# Patient Record
Sex: Male | Born: 1995
Health system: Southern US, Community
[De-identification: ages and names within clinical notes are randomized; demographics above are authoritative.]

## PROBLEM LIST (undated history)

## (undated) DIAGNOSIS — F909 Attention-deficit hyperactivity disorder, unspecified type: Secondary | ICD-10-CM

## (undated) DIAGNOSIS — F419 Anxiety disorder, unspecified: Secondary | ICD-10-CM

## (undated) DIAGNOSIS — Z808 Family history of malignant neoplasm of other organs or systems: Secondary | ICD-10-CM

## (undated) HISTORY — DX: Attention-deficit hyperactivity disorder, unspecified type: F90.9

## (undated) HISTORY — DX: Anxiety disorder, unspecified: F41.9

## (undated) HISTORY — DX: Family history of malignant neoplasm of other organs or systems: Z80.8

## (undated) HISTORY — PX: OTHER SURGICAL HISTORY: SHX169

---
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
## 2013-04-03 ENCOUNTER — Institutional Professional Consult (permissible substitution) (INDEPENDENT_AMBULATORY_CARE_PROVIDER_SITE_OTHER): Payer: BC Managed Care – PPO | Admitting: Family

## 2013-04-03 DIAGNOSIS — F411 Generalized anxiety disorder: Secondary | ICD-10-CM

## 2013-04-03 DIAGNOSIS — F909 Attention-deficit hyperactivity disorder, unspecified type: Secondary | ICD-10-CM

## 2013-08-16 ENCOUNTER — Institutional Professional Consult (permissible substitution): Payer: BC Managed Care – PPO | Admitting: Family

## 2013-08-16 DIAGNOSIS — F909 Attention-deficit hyperactivity disorder, unspecified type: Secondary | ICD-10-CM

## 2014-03-28 ENCOUNTER — Institutional Professional Consult (permissible substitution): Payer: BC Managed Care – PPO | Admitting: Family

## 2014-04-09 ENCOUNTER — Institutional Professional Consult (permissible substitution): Payer: BC Managed Care – PPO | Admitting: Family

## 2014-04-09 DIAGNOSIS — F909 Attention-deficit hyperactivity disorder, unspecified type: Secondary | ICD-10-CM

## 2015-05-08 ENCOUNTER — Other Ambulatory Visit: Payer: Self-pay | Admitting: Sports Medicine

## 2015-05-08 DIAGNOSIS — S6991XA Unspecified injury of right wrist, hand and finger(s), initial encounter: Secondary | ICD-10-CM

## 2015-10-24 ENCOUNTER — Other Ambulatory Visit: Payer: Self-pay | Admitting: Gastroenterology

## 2015-10-24 DIAGNOSIS — R110 Nausea: Secondary | ICD-10-CM

## 2015-10-27 ENCOUNTER — Institutional Professional Consult (permissible substitution): Payer: Self-pay | Admitting: Family

## 2015-10-28 ENCOUNTER — Institutional Professional Consult (permissible substitution) (INDEPENDENT_AMBULATORY_CARE_PROVIDER_SITE_OTHER): Payer: 59 | Admitting: Family

## 2015-10-28 DIAGNOSIS — F411 Generalized anxiety disorder: Secondary | ICD-10-CM

## 2015-10-28 DIAGNOSIS — F900 Attention-deficit hyperactivity disorder, predominantly inattentive type: Secondary | ICD-10-CM | POA: Diagnosis not present

## 2015-10-29 ENCOUNTER — Ambulatory Visit (HOSPITAL_COMMUNITY)
Admission: RE | Admit: 2015-10-29 | Discharge: 2015-10-29 | Disposition: A | Discharge status: Home / Self Care | Payer: 59 | Source: Ambulatory Visit | Attending: Gastroenterology | Admitting: Gastroenterology

## 2015-10-29 DIAGNOSIS — R110 Nausea: Secondary | ICD-10-CM

## 2015-10-29 DIAGNOSIS — R1011 Right upper quadrant pain: Secondary | ICD-10-CM | POA: Insufficient documentation

## 2015-10-29 DIAGNOSIS — K838 Other specified diseases of biliary tract: Secondary | ICD-10-CM | POA: Diagnosis not present

## 2015-11-06 ENCOUNTER — Ambulatory Visit (HOSPITAL_COMMUNITY): Payer: BC Managed Care – PPO

## 2015-11-12 ENCOUNTER — Ambulatory Visit (HOSPITAL_COMMUNITY)
Admission: RE | Admit: 2015-11-12 | Discharge: 2015-11-12 | Disposition: A | Discharge status: Home / Self Care | Payer: 59 | Source: Ambulatory Visit | Attending: Gastroenterology | Admitting: Gastroenterology

## 2015-11-12 DIAGNOSIS — R110 Nausea: Secondary | ICD-10-CM | POA: Diagnosis present

## 2015-11-12 MED ORDER — TECHNETIUM TC 99M MEBROFENIN IV KIT
5.0000 | PACK | Freq: Once | INTRAVENOUS | Status: AC | PRN
  Administered 2015-11-12: 5.08 via INTRAVENOUS

## 2015-11-17 ENCOUNTER — Encounter: Payer: Self-pay | Admitting: Family

## 2015-11-17 ENCOUNTER — Ambulatory Visit (INDEPENDENT_AMBULATORY_CARE_PROVIDER_SITE_OTHER): Payer: 59 | Admitting: Family

## 2015-11-17 VITALS — BP 102/66 | HR 66 | Resp 18 | Ht 74.0 in | Wt 185.2 lb

## 2015-11-17 DIAGNOSIS — F411 Generalized anxiety disorder: Secondary | ICD-10-CM | POA: Insufficient documentation

## 2015-11-17 DIAGNOSIS — F902 Attention-deficit hyperactivity disorder, combined type: Secondary | ICD-10-CM | POA: Insufficient documentation

## 2015-11-17 MED ORDER — SERTRALINE HCL 50 MG PO TABS: 50.0000 mg | ORAL_TABLET | Freq: Every day | ORAL | Status: DC

## 2015-11-17 NOTE — Patient Instructions (Signed)
Continue with medication as prescribed. May want to increase to 1 1/2 tablet of Zoloft 50 mg in 1-2 weeks in needed related to symptoms.

## 2015-11-17 NOTE — Progress Notes (Addendum)
  [facility] [address], [address] [place] [address] Dept: [phone] Dept Fax: [phone] Loc: [phone] Loc Fax: [phone]  Medical Follow-up  Patient ID: Ian [name], male  DOB: 1996/01/23, 20 y.o.  MRN: [id number]  Date of Evaluation: 11/17/15  PCP: Aura DialsBOUSKA,DAVID E, MD  Accompanied by: Mother and self Patient Lives with: patient  HISTORY/CURRENT STATUS:  HPI -Pt here with mother for updates on medication management for Anxiety.  EDUCATION: [employer]: [employer] Year/Grade: sophomore Homework Time: 2 Hours or more Performance/Grades: average Services: Other: Applying for accommodations Activities/Exercise: daily on campus when walking to classes  MEDICAL HISTORY: Appetite: Better then last visit, has eaten more routinely MVI/Other: None Fruits/Vegs:trying to include in diet Calcium: trying to include more in diet Iron:some  Sleep: Bedtime: 10-12:00am in bed Awakens: 10-11:00am, some days earlier Sleep Concerns: Initiation/Maintenance/Other: initiation difficulties if too hot in apt.  Individual Medical History/Review of System Changes? Yes significant weight loss related to increased anxiety, but now has seen Jeanella FlatterySarah Young for therapy to help with eating avoidance.  Allergies: Morphine and related  Current Medications:  Current outpatient prescriptions:  .  sertraline (ZOLOFT) 50 MG tablet, Take 1 tablet (50 mg total) by mouth daily., Disp: 30 tablet, Rfl: 2 Medication Side Effects: Other: Sleep disturbance when taking medication in the am  Family Medical/Social History Changes?: No  MENTAL HEALTH: Mental Health Issues: Anxiety  PHYSICAL EXAM: Vitals:  Today's Vitals   11/17/15 0909  BP: 102/66  Pulse: 66  Resp: 18  Height: 6\' 2"  (1.88 m)  Weight: 185 lb 3.2 oz (84.006 kg)  , Facility age limit for growth percentiles is 20  years.  General Exam: Physical Exam  Constitutional: He is oriented to person, place, and time. He appears well-developed. He is active.  HENT:  Head: Normocephalic and atraumatic.  Right Ear: External ear normal.  Left Ear: External ear normal.  Nose: Nose normal.  Mouth/Throat: Oropharynx is clear and moist.  Eyes: Conjunctivae and EOM are normal. Pupils are equal, round, and reactive to light.  Neck: Normal range of motion. Neck supple.  Cardiovascular: Normal rate, regular rhythm, normal heart sounds and intact distal pulses.   Pulmonary/Chest: Effort normal and breath sounds normal.  Abdominal: Soft. Bowel sounds are normal.  Neurological: He is alert and oriented to person, place, and time. He has normal reflexes.  Skin: Skin is warm and dry.  Psychiatric: He has a normal mood and affect. His behavior is normal. Judgment and thought content normal.  Vitals reviewed.   Neurological: oriented to time, place, and person Cranial Nerves: normal  Neuromuscular:  Motor Mass: normal  Tone: normal Strength: normal DTRs: normal 2+ and symmetric Overflow: none Reflexes: no tremors noted Sensory Exam: Vibratory: intact  Fine Touch: intact  Testing/Developmental Screens: ASRS-11/14  DIAGNOSES:    ICD-9-CM ICD-10-CM   1. Generalized anxiety disorder 300.02 F41.1   2. ADHD (attention deficit hyperactivity disorder), combined type 314.01 F90.2     RECOMMENDATIONS: Continue with medication as prescribed. May need to increase to 75mg  (Zoloft 50mg  1 1/2) in 1-2 weeks.  NEXT APPOINTMENT: Return in about 1 month (around 12/18/2015).  More than 50% of the appointment was spent counseling and discussing diagnosis and management of symptoms with the patient and family.  Carron Curieawn M. Paretta-Leahey, NP Counseling Time: 45  Total Contact Time: 50

## 2015-12-03 ENCOUNTER — Ambulatory Visit: Payer: 59 | Admitting: Psychology

## 2015-12-17 ENCOUNTER — Encounter: Payer: Self-pay | Admitting: Family

## 2015-12-17 ENCOUNTER — Ambulatory Visit (INDEPENDENT_AMBULATORY_CARE_PROVIDER_SITE_OTHER): Payer: 59 | Admitting: Family

## 2015-12-17 VITALS — BP 118/72 | HR 68 | Resp 20 | Ht 72.0 in | Wt 184.8 lb

## 2015-12-17 DIAGNOSIS — F411 Generalized anxiety disorder: Secondary | ICD-10-CM | POA: Diagnosis not present

## 2015-12-17 DIAGNOSIS — F902 Attention-deficit hyperactivity disorder, combined type: Secondary | ICD-10-CM

## 2015-12-17 NOTE — Progress Notes (Signed)
  [facility] [address], [address] [place] [address] Dept: [phone] Dept Fax: [phone] Loc: [phone] Loc Fax: [phone]  Medical Follow-up  Patient ID: Ian [name], male  DOB: 18-Jan-1996, 20 y.o.  MRN: [id number]  Date of Evaluation: 12/17/15  PCP: Aura DialsBOUSKA,DAVID E, MD  Accompanied by: Mother and patient Patient Lives with: mother and stepfather  HISTORY/CURRENT STATUS:  HPI  Patient here for routine follow up related to Anxiety and medication management.  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  Individual Medical History/Review of System Changes? No  Allergies: Morphine and related  Current Medications:  Current outpatient prescriptions:  .  sertraline (ZOLOFT) 50 MG tablet, Take 1 tablet (50 mg total) by mouth daily., Disp: 30 tablet, Rfl: 2 Medication Side Effects: Other: Nothing reported recently with increased dose to 50 mg  Zoloft daily.  Family Medical/Social History Changes?: No, mother and step-father [place], [place]  MENTAL HEALTH: Mental Health Issues: Anxiety-still dealing with some, but more able to handle it.  PHYSICAL EXAM: Vitals:  Today's Vitals   12/17/15 0949  BP: 118/72  Pulse: 68  Resp: 20  Height: 6' (1.829 m)  Weight: 184 lb 12.8 oz (83.825 kg)  , Facility age limit for growth percentiles is 20 years.  General Exam: Physical Exam  Constitutional: He is oriented to person, place, and time.  He appears well-developed and well-nourished.  HENT:  Head: Normocephalic and atraumatic.  Right Ear: External ear normal.  Left Ear: External ear normal.  Nose: Nose normal.  Mouth/Throat: Oropharynx is clear and moist.  Eyes: Conjunctivae and EOM are normal. Pupils are equal, round, and reactive to light.  Neck: Trachea normal, normal range of motion and full passive range of motion without pain. Neck supple.  Cardiovascular: Normal rate, regular rhythm, normal heart sounds and intact distal pulses.   Pulmonary/Chest: Effort normal and breath sounds normal.  Abdominal: Soft. Bowel sounds are normal.  Musculoskeletal: Normal range of motion.  Neurological: He is alert and oriented to person, place, and time. He has normal reflexes.  Skin: Skin is warm, dry and intact.  Psychiatric: He has a normal mood and affect. His behavior is normal. Judgment and thought content normal.  Vitals reviewed.   Neurological: oriented to time, place, and person Cranial Nerves: normal  Neuromuscular:  Motor Mass: normal Tone: normal Strength: normal DTRs: 2+ and symmetric Overflow: none Reflexes: no tremors noted Sensory Exam: Vibratory: intact  Fine Touch: intact  Testing/Developmental Screens:  ASRS-11/12 scored by patient     DIAGNOSES:    ICD-9-CM ICD-10-CM   1. Generalized anxiety disorder 300.02 F41.1   2. ADHD (attention deficit hyperactivity disorder), combined type 314.01 F90.2     RECOMMENDATIONS: Continue with medication of 50 mg Zoloft 1 daily, no script today.  NEXT APPOINTMENT: Return in about 3 months (around 03/17/2016) for follow up for 3 month viist.   Carron Curieawn M Paretta-Leahey, NP Counseling Time: 30 mins Total Contact Time: 40 mins  More than 50% of this visit was spent in counseling and coordination of care.

## 2016-01-30 ENCOUNTER — Telehealth: Payer: Self-pay | Admitting: Family

## 2016-01-30 MED ORDER — SERTRALINE HCL 100 MG PO TABS: 100.0000 mg | ORAL_TABLET | Freq: Every day | ORAL | Status: DC

## 2016-01-30 NOTE — Telephone Encounter (Signed)
RX for Zoloft 100 mg e-scribed and [facility].

## 2016-03-10 ENCOUNTER — Encounter: Payer: Self-pay | Admitting: Family

## 2016-03-10 ENCOUNTER — Ambulatory Visit (INDEPENDENT_AMBULATORY_CARE_PROVIDER_SITE_OTHER): Payer: 59 | Admitting: Family

## 2016-03-10 VITALS — BP 122/78 | HR 78 | Resp 16

## 2016-03-10 DIAGNOSIS — F902 Attention-deficit hyperactivity disorder, combined type: Secondary | ICD-10-CM | POA: Diagnosis not present

## 2016-03-10 DIAGNOSIS — F411 Generalized anxiety disorder: Secondary | ICD-10-CM | POA: Diagnosis not present

## 2016-03-10 NOTE — Progress Notes (Signed)
[name] [address], [address] [place] [address] Dept: [phone] Dept Fax: [phone] Loc: [phone] Loc Fax: [phone]  Medical Follow-up  Patient ID: Ian HallsBryan M [name], male  DOB: [birth year], 20 y.o.  MRN: [id number]  Date of Evaluation: 03/10/16  PCP: Aura DialsBOUSKA,Ian E, MD  Accompanied by: Self Patient Lives with: Self  HISTORY/CURRENT STATUS:  HPI  Patient here for routine follow up related to Anxiety/ADHD and medication management. Patient taking medication without side effects and getting positive results for decreasing his anxiety.  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 About 2 miles daily. Current Exercise Habits: The patient has a physically strenous job, but has no regular exercise apart from work., Intensity: Mild     MEDICAL HISTORY: Appetite: Better now and eating routinely. Eating 2-3 meals out-fast food. MVI/Other: None Fruits/Vegs:Some Calcium: Some Iron:Some  Sleep: Bedtime: 9:00-pm-2:00 am Awakens: 9:00 am Sleep Concerns: Initiation/Maintenance/Other: Better, Once every 2-3 weeks will have a hard night of sleeping  Individual Medical History/Review of System Changes? None reported since last visit.  Allergies: Morphine and related  Current Medications:  Current outpatient prescriptions:  .  sertraline (ZOLOFT) 100 MG tablet, Take 1 tablet (100 mg total) by mouth daily., Disp: 30 tablet, Rfl: 2 Medication Side Effects: None  Family Medical/Social History Changes?: No  MENTAL HEALTH: Mental Health Issues: Anxiety-Better now with Zoloft No flowsheet data found.   PHYSICAL EXAM: Vitals:    Today's Vitals   03/10/16 0927  BP: 122/78  Pulse: 78  Resp: 16  , Facility age limit for growth percentiles is 20 years.  General Exam: Physical Exam  Constitutional: He is oriented to person, place, and time. He appears well-developed and well-nourished.  HENT:  Head: Normocephalic and atraumatic.  Right Ear: External ear normal.  Left Ear: External ear normal.  Nose: Nose normal.  Mouth/Throat: Oropharynx is clear and moist.  Eyes: Conjunctivae and EOM are normal. Pupils are equal, round, and reactive to light.  Neck: Trachea normal, normal range of motion and full passive range of motion without pain. Neck supple.  Cardiovascular: Normal rate, regular rhythm, normal heart sounds and intact distal pulses.   Pulmonary/Chest: Effort normal and breath sounds normal.  Abdominal: Soft. Bowel sounds are normal.  Musculoskeletal: Normal range of motion.  Neurological: He is alert and oriented to person, place, and time. He has normal reflexes.  Skin: Skin is warm, dry and intact.  Psychiatric: He has a normal mood and affect. His behavior is normal. Judgment and thought content normal.  Vitals reviewed.  No concerns for toileting. Daily stool, no constipation or diarrhea. Void urine no difficulty. No enuresis.   Participate in daily oral hygiene to include brushing and flossing.  Neurological: oriented to time, place, and person Cranial Nerves: normal  Neuromuscular:  Motor Mass: Normal Tone: Normal Strength: Normal DTRs: 2+ and symmetric Overflow: None Reflexes: no tremors noted Sensory Exam: Vibratory: Intact  Fine Touch: Intact  Testing/Developmental Screens:  ASRS -15/19    DIAGNOSES:    ICD-9-CM ICD-10-CM   1. Generalized anxiety disorder 300.02 F41.1   2. ADHD (attention deficit hyperactivity disorder), combined type 314.01 F90.2     RECOMMENDATIONS: 3 month follow up and continuation with medication. Zoloft 100 mg daily with no refills today. Will  call in July  for next refill needed.  Continuing to encourage increased good calories for Ian [name] since he has a significant weight loss with his increased anxiety- Nutritional recommendations include the increase of calories, making foods more calorically dense by adding calories to foods eaten.  Increase Protein in the morning.  Parents may add instant breakfast mixes to milk, butter and sour cream to potatoes, and peanut butter dips for fruit.  The parents should discourage "grazing" on foods and snacks through the day and decrease the amount of fluid consumed.  Children are largely volume driven and will fill up on liquids thereby decreasing their appetite for solid foods.  Things that can help decrease anxiety...  Apps: Mindshift StopBreatheThink Relax & Rest Smiling Mind Yoga By [employer]  Websites: Worry [employer]ise [employer] Www.worrywisekids.org  Anxiety & Depression Association of America Www.adaa.org  The Social Anxiety Institute Www.socialanxietyinstitute.org  Suggested following up with counseling related to history of anxiety for CBT.  NEXT APPOINTMENT: Return in about 3 months (around 06/10/2016) for follow up.  More than 50% of the appointment was spent counseling and discussing diagnosis and management of symptoms with the patient and family. Carron Curieawn M Paretta-Leahey, NP Counseling Time: 30 mins Total Contact Time: 40 mins

## 2016-04-29 ENCOUNTER — Other Ambulatory Visit: Payer: Self-pay | Admitting: Family

## 2016-04-30 ENCOUNTER — Other Ambulatory Visit: Payer: Self-pay | Admitting: Family

## 2016-04-30 MED ORDER — SERTRALINE HCL 100 MG PO TABS: 100.0000 mg | ORAL_TABLET | Freq: Every day | ORAL | 2 refills | Status: DC

## 2016-04-30 NOTE — Telephone Encounter (Signed)
Mom called for refill for Zoloft.  Patient last seen 03/10/16, next appointment 05/26/16.  Please [facility] in to [facility] [phone] as soon as possible.  Patient is out of meds.

## 2016-04-30 NOTE — Telephone Encounter (Signed)
RX for Zoloft e-scribed and sent to pharmacy

## 2016-05-26 ENCOUNTER — Institutional Professional Consult (permissible substitution): Payer: Self-pay | Admitting: Family

## 2016-05-31 ENCOUNTER — Ambulatory Visit (INDEPENDENT_AMBULATORY_CARE_PROVIDER_SITE_OTHER): Payer: 59 | Admitting: Family

## 2016-05-31 ENCOUNTER — Encounter: Payer: Self-pay | Admitting: Family

## 2016-05-31 VITALS — BP 108/68 | HR 88 | Resp 16 | Ht 73.75 in | Wt 183.0 lb

## 2016-05-31 DIAGNOSIS — F411 Generalized anxiety disorder: Secondary | ICD-10-CM | POA: Diagnosis not present

## 2016-05-31 DIAGNOSIS — F902 Attention-deficit hyperactivity disorder, combined type: Secondary | ICD-10-CM | POA: Diagnosis not present

## 2016-05-31 MED ORDER — SERTRALINE HCL 100 MG PO TABS: 100.0000 mg | ORAL_TABLET | Freq: Every day | ORAL | 2 refills | Status: DC

## 2016-05-31 NOTE — Progress Notes (Signed)
[facility] [address], [address] [place] [address] Dept: [phone] Dept Fax: [phone] Loc: [phone] Loc Fax: [phone]  Medical Follow-up  Patient ID: Ian HallsBryan M Humes, male  DOB: Apr 29, 1996, 20 y.o.  MRN: [id number]  Date of Evaluation: 05/31/16   PCP: Aura DialsBOUSKA,DAVID E, MD  Accompanied by: Self Patient Lives with: Self  HISTORY/CURRENT STATUS:  HPI  Patient here for routine follow up related to Anxiety/ADHD and medication management. Patient taking 5 classes this semester and at this time handling these well. Taking Zoloft 50 mg 1 daily without any side effects and doing well with decreasing the amount anxiety, especially with school.  EDUCATION: School: [employer] Year/Grade: Sophomore Homework Time: Increased time depending on class demands Performance/Grades: average Services: IEP/504 Plan Activities/Exercise: daily-walking to classes daily  MEDICAL HISTORY: Appetite: Better now, now only 2-3 times/week fast food.  MVI/Other: Daily Fruits/Vegs:some Calcium: some Iron:some  Sleep: Bedtime: 3:00 am latest Awakens: 7:00 am Sleep Concerns: Initiation/Maintenance/Other: Sleeping on the couch past few times with school work to complete on computer. Napping during the day and will not fall asleep until later for the night.   Individual Medical History/Review of System Changes? None reported recently by patient.  Allergies: Morphine and related  Current Medications:  Current Outpatient Prescriptions:  .  sertraline (ZOLOFT) 100 MG tablet, Take 1 tablet (100 mg total) by mouth daily., Disp: 30 tablet, Rfl: 2 Medication Side Effects: None  Family Medical/Social History Changes?: No  MENTAL HEALTH: Mental Health Issues: Anxiety-much less with Zoloft now. 108/62   PHYSICAL EXAM: Vitals:  Today's Vitals   05/31/16 1110  BP: 108/68    Pulse: 88  Resp: 16  Weight: 183 lb (83 kg)  Height: 6' 1.75" (1.873 m)  PainSc: 0-No pain  , Facility age limit for growth percentiles is 20 years.  General Exam: Physical Exam  Constitutional: He is oriented to person, place, and time. He appears well-developed and well-nourished.  HENT:  Head: Normocephalic and atraumatic.  Right Ear: External ear normal.  Left Ear: External ear normal.  Nose: Nose normal.  Mouth/Throat: Oropharynx is clear and moist.  Eyes: Conjunctivae and EOM are normal. Pupils are equal, round, and reactive to light.  Neck: Trachea normal, normal range of motion and full passive range of motion without pain. Neck supple.  Cardiovascular: Normal rate, regular rhythm, normal heart sounds and intact distal pulses.   Pulmonary/Chest: Effort normal and breath sounds normal.  Abdominal: Soft. Bowel sounds are normal.  Musculoskeletal: Normal range of motion.  Neurological: He is alert and oriented to person, place, and time. He has normal reflexes.  Skin: Skin is warm, dry and intact. Capillary refill takes less than 2 seconds.  Psychiatric: He has a normal mood and affect. His behavior is normal. Judgment and thought content normal.  Vitals reviewed.   Neurological: oriented to time, place, and person Cranial Nerves: normal  Neuromuscular:  Motor Mass: Normal Tone: Normal Strength: Normal DTRs: 2+ and symmetric Overflow: None Reflexes: no tremors noted Sensory Exam: Vibratory: Intact  Fine Touch: Intact  Testing/Developmental Screens:  ASRS-15/16 scored by patient     DIAGNOSES:    ICD-9-CM ICD-10-CM   1. ADHD (attention deficit hyperactivity disorder), combined type 314.01 F90.2   2. Generalized anxiety disorder 300.02 F41.1     RECOMMENDATIONS: 3 month follow up and continuation of Zoloft 50 mg 1 daily, # 30 with 2 RF's [facility]  [address].  Doing well with history of anxiety and eating better daily without stomach aches or other GI  complaints. Will continue to monitor and report any changes. Discussed healthy food choices and preplanning meals.   Sleep hygiene reviewed with school schedule. *Teens need about 9 hours of sleep a night. Younger children need more sleep (10-11 hours a night) and adults need slightly less (7-9 hours each night).  11 Tips to Follow:  1. No caffeine after 3pm: Avoid beverages with caffeine (soda, tea, energy drinks, etc.) especially after 3pm. 2. Don't go to bed hungry: Have your evening meal at least 3 hrs. before going to sleep. It's fine to have a small bedtime snack such as a glass of milk and a few crackers but don't have a big meal. 3. Have a nightly routine before bed: Plan on "winding down" before you go to sleep. Begin relaxing about 1 hour before you go to bed. Try doing a quiet activity such as listening to calming music, reading a book or meditating. 4. Turn off the TV and ALL electronics including video games, tablets, laptops, etc. 1 hour before sleep, and keep them out of the bedroom. 5. Turn off your cell phone and all notifications (new email and text alerts) or even better, leave your phone outside your room while you sleep. Studies have shown that a part of your brain continues to respond to certain lights and sounds even while you're still asleep. 6. Make your bedroom quiet, dark and cool. If you can't control the noise, try wearing earplugs or using a fan to block out other sounds. 7. Practice relaxation techniques. Try reading a book or meditating or drain your brain by writing a list of what you need to do the next day. 8. Don't nap unless you feel sick: you'll have a better night's sleep. 9. Don't smoke, or quit if you do. Nicotine, alcohol, and marijuana can all keep you awake. Talk to your health care provider if you need help with substance use. 10. Most importantly, wake up at the same time every day (or within 1 hour of your usual wake up time) EVEN on the weekends. A regular  wake up time promotes sleep hygiene and prevents sleep problems. 11. Reduce exposure to bright light in the last three hours of the day before going to sleep. Maintaining good sleep hygiene and having good sleep habits lower your risk of developing sleep problems. Getting better sleep can also improve your concentration and alertness. Try the simple steps in this guide. If you still have trouble getting enough rest, make an appointment with your health care provider.   NEXT APPOINTMENT: Return in about 3 months (around 08/30/2016) for follow up.  More than 50% of the appointment was spent counseling and discussing diagnosis and management of symptoms with the patient and family.  Carron Curie, NP Counseling Time: 30 mins Total Contact Time: 40 mins

## 2016-08-30 ENCOUNTER — Ambulatory Visit (INDEPENDENT_AMBULATORY_CARE_PROVIDER_SITE_OTHER): Payer: 59 | Admitting: Family

## 2016-08-30 ENCOUNTER — Encounter: Payer: Self-pay | Admitting: Family

## 2016-08-30 VITALS — BP 112/78 | HR 76 | Ht 73.75 in | Wt 185.2 lb

## 2016-08-30 DIAGNOSIS — F411 Generalized anxiety disorder: Secondary | ICD-10-CM | POA: Diagnosis not present

## 2016-08-30 DIAGNOSIS — F902 Attention-deficit hyperactivity disorder, combined type: Secondary | ICD-10-CM | POA: Diagnosis not present

## 2016-08-30 MED ORDER — SERTRALINE HCL 100 MG PO TABS: 100.0000 mg | ORAL_TABLET | Freq: Every day | ORAL | 2 refills | Status: DC

## 2016-08-30 NOTE — Progress Notes (Signed)
[facility] [address], [address] [place] [address] Dept: [phone] Dept Fax: [phone] Loc: [phone] Loc Fax: [phone]  Medical Follow-up  Patient ID: Ian HallsBryan M [name], male  DOB: 28-Feb-1996, 20 y.o.  MRN: [id number]  Date of Evaluation: 08/30/16  PCP: Ian DialsBOUSKA,DAVID E, MD  Accompanied by: Self` Patient Lives with: self  HISTORY/CURRENT STATUS:  HPI  Patient here for routine follow up related to AnxietyADHD and medication management. Patient doing well on current Zoloft 100 mg daily without any side effects reported.   EDUCATION: School: [employer]  Year/Grade: sophomore Homework Time: Increased time depending on class and passed this semester. Marketing classes, finance classes this spring.  Performance/Grades: average Services: Other: [employer] if needed Activities/Exercise: rarely, walking on campus  MEDICAL HISTORY: Appetite: Better, no problems finding snacks or eating.  MVI/Other: None Fruits/Vegs:Some Calcium: Some Iron:Some  Sleep: Bedtime: 9:00-2:00 am Awakens: 9:00 am Sleep Concerns: Initiation/Maintenance/Other: No problems while school in session  Individual Medical History/Review of System Changes? None recently, per patient's report. Has has some contact with father on a regular basis, no problems with   Allergies: Morphine and related  Current Medications:  Current Outpatient Prescriptions:  .  sertraline (ZOLOFT) 100 MG tablet, Take 1 tablet (100 mg total) by mouth daily., Disp: 30 tablet, Rfl: 2 Medication Side Effects: None  Family Medical/Social History Changes?: No  MENTAL HEALTH: Mental Health Issues: Anxiety-Decreased on Zoloft 100 mg daily, no suicidal ideations or thoughts.   PHYSICAL EXAM: Vitals: There were no vitals filed for this visit., Facility age limit for growth percentiles is 20  years.  General Exam: Physical Exam  Constitutional: He is oriented to person, place, and time. He appears well-developed and well-nourished.  HENT:  Head: Normocephalic and atraumatic.  Right Ear: External ear normal.  Left Ear: External ear normal.  Nose: Nose normal.  Mouth/Throat: Oropharynx is clear and moist.  Eyes: Conjunctivae and EOM are normal. Pupils are equal, round, and reactive to light.  Neck: Trachea normal, normal range of motion and full passive range of motion without pain. Neck supple.  Cardiovascular: Normal rate, regular rhythm, normal heart sounds and intact distal pulses.   Pulmonary/Chest: Effort normal and breath sounds normal.  Abdominal: Soft. Bowel sounds are normal.  Musculoskeletal: Normal range of motion.  Neurological: He is alert and oriented to person, place, and time. He has normal reflexes.  Skin: Skin is warm, dry and intact. Capillary refill takes less than 2 seconds.  Psychiatric: He has a normal mood and affect. His behavior is normal. Judgment and thought content normal.  Vitals reviewed.  No concerns for toileting. Daily stool, no constipation or diarrhea. Void urine no difficulty. No enuresis.   Participate in daily oral hygiene to include brushing and flossing.  Neurological: oriented to time, place, and person Cranial Nerves: normal  Neuromuscular:  Motor Mass: Normal Tone: Normal Strength: Normal  DTRs: 2+ and symmetric Overflow: None Reflexes: no tremors noted Sensory Exam: Vibratory: Intact  Fine Touch: Intact  Testing/Developmental Screens:  ASRS-11/13 scored by patient and reviewed     DIAGNOSES:    ICD-9-CM ICD-10-CM   1. Generalized anxiety disorder 300.02 F41.1   2. ADHD (attention deficit hyperactivity disorder), combined type 314.01 F90.2     RECOMMENDATIONS: 3 month follow up and medication management. Zoloft 100 mg daily, # 30 with 2 RF's escribed to [facility] in [place].  Sleep hygiene issues were discussed and  educational information was provided.  The discussion included sleep cycles, sleep hygiene, the importance of avoiding TV and video screens for the hour before bedtime, dietary sources of melatonin and the use of melatonin supplementation.  Supplemental melatonin 1 to 3 mg, can be used at bedtime to assist with sleep onset, as needed.  Give 1.5 to 3 mg, one hour before bedtime and repeat if not asleep in one hour.  When a good sleep routine is established, stop daily administration and give on nights the patient is not asleep in 30 minutes after lights out.   Continuation of daily oral hygiene to include flossing and brushing daily, using antimicrobial toothpaste, as well as routine dental exams and twice yearly cleaning.  Recommend supplementation with a multivitamin and omega-3 fatty acids daily.  Maintain adequate intake of Calcium and Vitamin D.  NEXT APPOINTMENT: Return in about 3 months (around 11/28/2016) for follow up visit.  More than 50% of the appointment was spent counseling and discussing diagnosis and management of symptoms with the patient and family.  Carron Curieawn M Paretta-Leahey, NP Counseling Time: 30 mins Total Contact Time: 40 mins

## 2016-11-16 IMAGING — NM NM HEPATO W/GB/PHARM/[PERSON_NAME]
1 series · 1 of 1 positions shown · non-contrast
Comparison: Abdominal ultrasound 10/29/2015

CLINICAL DATA: Abdominal pain, nausea and vomiting and intolerance
to fatty foods for 1 month.

EXAM:
NUCLEAR MEDICINE HEPATOBILIARY IMAGING WITH GALLBLADDER EF
TECHNIQUE: Sequential images of the abdomen were obtained [DATE] minutes
following intravenous administration of radiopharmaceutical. After
slow intravenous infusion of 1.7 micrograms Cholecystokinin,
gallbladder ejection fraction was determined.
RADIOPHARMACEUTICALS:  5.08 mCi Kc-SSm Choletec IV

[rt lat · 1 of 1 slices shown]
[im 1/1]
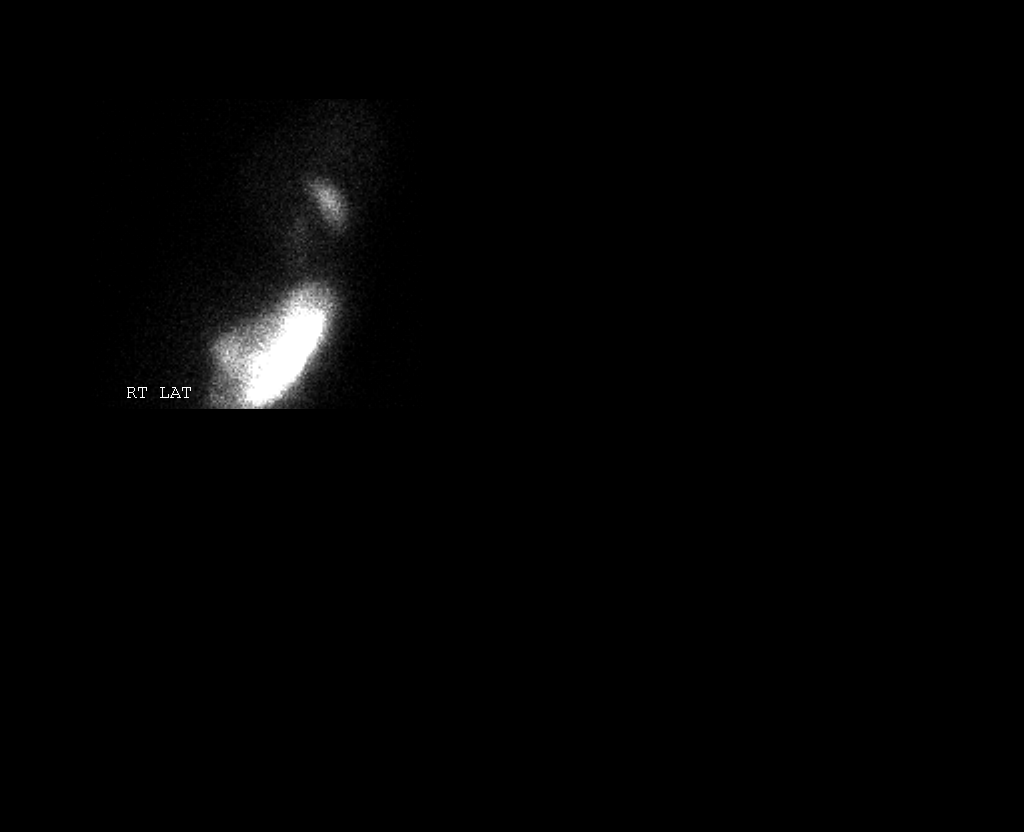

[1 of 1 positions shown; findings below may reference images not displayed]

FINDINGS: Symmetric uptake in the liver and prompt excretion into the biliary
tree which is visualized at 10 minutes. Activity is seen in the
gallbladder at 55 minutes. Small bowel activity is seen at 15
minutes. Patient received a protocoled infusion of CCK. Gallbladder
ejection fraction was calculated at 69.8%. At 60 min, normal
ejection fraction is greater than 40%.
IMPRESSION: 1. Normal biliary patency study.
2. Normal gallbladder ejection fraction.

## 2016-12-15 ENCOUNTER — Other Ambulatory Visit: Payer: Self-pay | Admitting: Family

## 2016-12-15 NOTE — Telephone Encounter (Signed)
Has not been seen since 08/2016

## 2017-04-17 ENCOUNTER — Other Ambulatory Visit: Payer: Self-pay | Admitting: Family

## 2017-05-27 ENCOUNTER — Telehealth: Payer: Self-pay | Admitting: Family

## 2017-05-27 MED ORDER — SERTRALINE HCL 100 MG PO TABS: 100.0000 mg | ORAL_TABLET | Freq: Every day | ORAL | 0 refills | Status: DC

## 2017-05-27 NOTE — Telephone Encounter (Signed)
Escribed Zoloft 100 mg script # 30 with no refills to [facility]. Note included that it was the LAST SCRIPT UNTIL SEEN.

## 2017-05-30 ENCOUNTER — Ambulatory Visit (INDEPENDENT_AMBULATORY_CARE_PROVIDER_SITE_OTHER): Payer: 59 | Admitting: Family

## 2017-05-30 ENCOUNTER — Encounter: Payer: Self-pay | Admitting: Family

## 2017-05-30 VITALS — BP 112/64 | HR 72 | Resp 16 | Ht 73.75 in | Wt 182.4 lb

## 2017-05-30 DIAGNOSIS — Z79899 Other long term (current) drug therapy: Secondary | ICD-10-CM

## 2017-05-30 DIAGNOSIS — F411 Generalized anxiety disorder: Secondary | ICD-10-CM | POA: Diagnosis not present

## 2017-05-30 DIAGNOSIS — Z7189 Other specified counseling: Secondary | ICD-10-CM | POA: Diagnosis not present

## 2017-05-30 DIAGNOSIS — F902 Attention-deficit hyperactivity disorder, combined type: Secondary | ICD-10-CM | POA: Diagnosis not present

## 2017-05-30 DIAGNOSIS — Z719 Counseling, unspecified: Secondary | ICD-10-CM

## 2017-05-30 NOTE — Progress Notes (Signed)
[facility] [address], [place] [place] [address] Dept: [phone] Dept Fax: [phone] Loc: [phone] Loc Fax: [phone]  Medical Follow-up  Patient ID: Ian [name], male  DOB: Apr 07, 1996, 21 y.o.  MRN: [id number]  Date of Evaluation: 05/1717  PCP: Tracey Harries, MD  Accompanied by: self Patient Lives with: Linton Ham to [address] place [address] on [place]  HISTORY/CURRENT STATUS:  HPI  Patient here for routine follow up related to Anxiety, ADHD and medication management. Patient here by himself for today's visit. Interactive and cooperative with provider. Patient has continued with school and will graduate after this semester. 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   EDUCATION: School: [employer]  Year/Grade: Last semester, graduates after this semester Homework Time: Depending on class demands Performance/Grades: average Services: IEP/504 Plan and Other: tutoring as needed Activities/Exercise: intermittently-walking on campus Working: Company in [employer] and working on Administrator, sports aspect of company.   MEDICAL HISTORY: Appetite: Good, more variety of foods daily. MVI/Other: Daily Fruits/Vegs:Better Calcium: Good  Iron:Variety  Sleep: Not getting 8 hours/night, some nights not sleeping but only a few hours.  Sleep Concerns: Initiation/Maintenance/Other: Off and on with new place recently  Individual Medical History/Review of System Changes? Yes, mild case of viral infection with OTC treatment.   Allergies: Morphine and related  Current Medications:  Current Outpatient Prescriptions:  .  sertraline (ZOLOFT) 100 MG tablet, TAKE 1 TABLET BY MOUTH DAILY, Disp: 30 tablet, Rfl: 0 .  sertraline (ZOLOFT) 100 MG tablet, Take 1 tablet (100 mg total) by  mouth daily., Disp: 30 tablet, Rfl: 0 Medication Side Effects: None  Family Medical/Social History Changes?: None, mother remarried and moved to [place].   MENTAL HEALTH: Mental Health Issues: Anxiety-less now with continuation of Zoloft 100 mg daily. Nos suicidal thoughts or ideations.   PHYSICAL EXAM: Vitals:  Today's Vitals   05/30/17 0914  BP: 112/64  Pulse: 72  Resp: 16  Weight: 182 lb 6.4 oz (82.7 kg)  Height: 6' 1.75" (1.873 m)  PainSc: 0-No pain  , Facility age limit for growth percentiles is 20 years.  General Exam: Physical Exam  Constitutional: He is oriented to person, place, and time. He appears well-developed and well-nourished.  HENT:  Head: Normocephalic and atraumatic.  Right Ear: External ear normal.  Left Ear: External ear normal.  Nose: Nose normal.  Mouth/Throat: Oropharynx is clear and moist.  Eyes: Pupils are equal, round, and reactive to light. Conjunctivae and EOM are normal.  Neck: Trachea normal, normal range of motion and full passive range of motion without pain. Neck supple.  Cardiovascular: Normal rate, regular rhythm, normal heart sounds and intact distal pulses.   Pulmonary/Chest: Effort normal and breath sounds normal.  Abdominal: Soft. Bowel sounds are normal.  Genitourinary:  Genitourinary Comments: Deferred  Musculoskeletal: Normal range of motion.  Neurological: He is alert and oriented to person, place, and time. He has normal reflexes.  Skin: Skin is warm, dry and intact. Capillary refill takes less than 2 seconds.  Psychiatric: He has a normal mood and affect. His behavior is normal. Judgment and thought content normal.  Vitals reviewed.  Review of Systems  Psychiatric/Behavioral: The patient is nervous/anxious.   All other systems reviewed and are negative.  Patient reports no concerns for toileting. Daily stool, no constipation or diarrhea. Void urine no difficulty. No enuresis.  Participate in daily oral hygiene to  include brushing and flossing.  Neurological: oriented to time, place, and person Cranial Nerves: normal  Neuromuscular:  Motor Mass: Normal Tone: Normal Strength: Normal DTRs: 2+ and symmetric Overflow: None Reflexes: no tremors noted Sensory Exam: Vibratory: Intact  Fine Touch: Intact  Testing/Developmental Screens:  ASRS-15/17 scored by patient and counseled   DIAGNOSES:    ICD-10-CM   1. Generalized anxiety disorder F41.1   2. ADHD (attention deficit hyperactivity disorder), combined type F90.2   3. Patient counseled Z71.9   4. Medication management Z79.899   5. Goals of care, counseling/discussion Z71.89     RECOMMENDATIONS: 3 month follow up and continuation of medication. Counseled on medication management. Script escribed for Zoloft 100 mg daily, # 30 with 2 RF's sent to [facility] on 05/27/17.  Medication adherence reviewed with patient along with efficacy of taking daily dosing to control his symptoms of anxiety.   Information reviewed with patient regarding projected graduation after this semester and seeking full-time employment.  Reviewed at length sleep hygiene with patient and conforming to a schedule for adequate sleep each night. 8-10 hours needed and patient lacking this due to electronic or other busy night time activities.   Recommended decreasing screen time at night about 1 hour before HS to allow brain to "settle down" and sleep initiation will be much easier.   Advised on healthy eating habits with lean protein, fruits, vegetables and nuts with limited fast foods or junk foods. Also encouraged increasing water intake and eating at least 3 meals daily without skipping meals.   Directed patient to f/u with PCP yearly, updated vaccines and flu shot, dentist every 6 months, MVI daily and regular exercise for health maintenance.   NEXT APPOINTMENT: Return in about 3 months (around 08/29/2017) for follow up visit.  More than 50% of the appointment was spent  counseling and discussing diagnosis and management of symptoms with the patient and family.  Carron Curie, NP Counseling Time: 30 mins Total Contact Time: 40 mins

## 2017-07-01 ENCOUNTER — Other Ambulatory Visit: Payer: Self-pay | Admitting: Family

## 2017-07-01 NOTE — Telephone Encounter (Signed)
Not seen since 08/2016. Has appointment scheduled for 08/2017. Approved 1 month supply

## 2017-08-05 ENCOUNTER — Other Ambulatory Visit: Payer: Self-pay | Admitting: Pediatrics

## 2017-08-08 ENCOUNTER — Other Ambulatory Visit: Payer: Self-pay | Admitting: Family

## 2017-08-08 MED ORDER — SERTRALINE HCL 100 MG PO TABS: 100.0000 mg | ORAL_TABLET | Freq: Every day | ORAL | 0 refills | Status: DC

## 2017-08-08 NOTE — Telephone Encounter (Signed)
[facility].  Patient last seen 05/30/17, next appointment 08/31/17.  Second request.

## 2017-08-08 NOTE — Telephone Encounter (Signed)
Zoloft 100mg  #30 [facility]

## 2017-08-31 ENCOUNTER — Ambulatory Visit: Payer: 59 | Admitting: Family

## 2017-08-31 ENCOUNTER — Encounter: Payer: Self-pay | Admitting: Family

## 2017-08-31 VITALS — BP 122/70 | HR 78 | Resp 16 | Ht 74.0 in | Wt 194.8 lb

## 2017-08-31 DIAGNOSIS — F902 Attention-deficit hyperactivity disorder, combined type: Secondary | ICD-10-CM

## 2017-08-31 DIAGNOSIS — F411 Generalized anxiety disorder: Secondary | ICD-10-CM | POA: Diagnosis not present

## 2017-08-31 DIAGNOSIS — Z719 Counseling, unspecified: Secondary | ICD-10-CM | POA: Diagnosis not present

## 2017-08-31 DIAGNOSIS — Z79899 Other long term (current) drug therapy: Secondary | ICD-10-CM | POA: Diagnosis not present

## 2017-08-31 DIAGNOSIS — G479 Sleep disorder, unspecified: Secondary | ICD-10-CM

## 2017-08-31 MED ORDER — SERTRALINE HCL 100 MG PO TABS: 100.0000 mg | ORAL_TABLET | Freq: Every day | ORAL | 2 refills | Status: DC

## 2017-08-31 NOTE — Progress Notes (Signed)
[facility] [address], [address] [place] [address] Dept: [phone] Dept Fax: [phone] Loc: [phone] Loc Fax: [phone]  Medical Follow-up  Patient ID: Ian [name], male  DOB: [birth year], 21 y.o.  MRN: [id number]  Date of Evaluation: 08/31/17  PCP: Tracey HarriesBouska, David, MD  Accompanied by: Self Patient Lives with: by himself with cats  HISTORY/CURRENT STATUS:  HPI  Patient here for routine follow up related to ADHD, Anxiety, and medication management. Patient here by himself and graduated recently from [employer]. Interactive with provider and talkative regarding job searching. Living by himself with [place] 2 cats [place] and not looking to relocate.   EDUCATION/WORK: School: [employer]  Year/Grade: Graduated recently  Performance/Grades: average Services: Other: Help as needed in an academic center Activities/Exercise: intermittently, not much Working: Not regularly and looking for work now that he's graduated.  MEDICAL HISTORY: Appetite: Good MVI/Other: Daily Fruits/Vegs:Some Calcium: Good Iron:Variety  Sleep: Bedtime: 5-7 hours most nights.  Sleep Concerns: Initiation/Maintenance/Other: Not having regular issues with waking.   Individual Medical History/Review of System Changes? Yes, no recent problems or sickness/illnesses.   Allergies: Morphine and related  Current Medications:  Current Outpatient Medications:  .  sertraline (ZOLOFT) 100 MG tablet, Take 1 tablet (100 mg total) by mouth daily., Disp: 30 tablet, Rfl: 2 Medication Side Effects: None  Family Medical/Social History Changes?: Yes, mother and new [place].   MENTAL HEALTH: Mental Health Issues: Anxiety-under control and has continued daily dosing, Zoloft 100 mg daily without side effects. No suicidal thoughts or ideations reported.    PHYSICAL EXAM: Vitals:  Today's Vitals   08/31/17 1412  BP: 122/70  Pulse: 78  Resp: 16  Weight: 194 lb 12.8 oz (88.4 kg)  Height: 6\' 2"  (1.88 m)  PainSc: 0-No pain  , Facility age limit for growth percentiles is 20 years.  General Exam: Physical Exam  Constitutional: He is oriented to person, place, and time. He appears well-developed and well-nourished.  HENT:  Head: Normocephalic and atraumatic.  Right Ear: External ear normal.  Left Ear: External ear normal.  Nose: Nose normal.  Mouth/Throat: Oropharynx is clear and moist.  Eyes: Conjunctivae and EOM are normal. Pupils are equal, round, and reactive to light.  Neck: Trachea normal, normal range of motion and full passive range of motion without pain. Neck supple.  Cardiovascular: Normal rate, regular rhythm, normal heart sounds and intact distal pulses.  Pulmonary/Chest: Effort normal and breath sounds normal.  Abdominal: Soft. Bowel sounds are normal.  Genitourinary:  Genitourinary Comments: Deferred  Musculoskeletal: Normal range of motion.  Neurological: He is alert and oriented to person, place, and time. He has normal reflexes.  Skin: Skin is warm, dry and intact. Capillary refill takes less than 2 seconds.  Psychiatric: He has a normal mood and affect. His behavior is normal. Judgment and thought content normal.  Vitals reviewed.  Review of Systems  Psychiatric/Behavioral: Positive for decreased concentration and sleep disturbance.  All other systems reviewed and are negative.  Patient with no concerns for toileting. Daily stool, no constipation or diarrhea. Void urine no difficulty. No enuresis.   Participate in daily oral hygiene to include brushing and flossing.  Neurological: oriented to time, place, and person Cranial Nerves: normal  Neuromuscular:  Motor Mass: Normal Tone: Normal Strength: Normal  DTRs: 2+ and symmetric Overflow: None Reflexes: no tremors noted Sensory Exam: Vibratory: Intact  Fine Touch: Intact  Testing/Developmental Screens:  ASRS-12/18 scored by paitent and counseled today    DIAGNOSES:    ICD-10-CM   1. Generalized anxiety disorder F41.1   2. ADHD (attention deficit hyperactivity disorder), combined type F90.2   3. Sleep disturbances G47.9   4. Medication management Z79.899   5. Patient counseled Z71.9     RECOMMENDATIONS: 3 month follow up and continuation of medication. Counseled on medication management along with adherence. Zoloft 100 mg daily, # 30 with 2 RF's escribed to pharmacy on file.   Anxiety medications reviewed with patient to include medication along with  pharmacologic properties of each. Recommendation for specific medication to include dose, administration, expected effects, possible side effects and the risk to benefit ratio of medication management (Zoloft).  Information regarding graduation and job searching in the [place] area discussed with patient. Support and encouragement provided with upcoming interview.  Recommended better eating habits with enough protein, fruits, vegetables and more water intake daily. Suggested limiting ordering out in the evening or fast foods, avoiding sugary foods/drinks and junk foods for weight management.  Suggested patient to start to incorporated regular exercise into his daily routine. Patient concerns with weight gain of 12#, but not from weight itself, the amount that is fat and not muscle. Encouaraged regular cardiovascular exercise with weight training 3-4 days/week.   Directed to f/u with PCP yearly, routine dental visits, MVI daily, healthy eating habits, regular exercise and good sleep hygiene.   NEXT APPOINTMENT: Return in about 3 months (around 11/29/2017) for follow up visit.  More than 50% of the appointment was spent counseling and discussing diagnosis and management of symptoms with the patient and family.  Carron Curieawn M Paretta-Leahey, NP Counseling Time: 30 mins Total Contact Time: 40  mins

## 2017-12-02 ENCOUNTER — Institutional Professional Consult (permissible substitution): Payer: Self-pay | Admitting: Family

## 2017-12-02 ENCOUNTER — Telehealth: Payer: Self-pay | Admitting: Family

## 2017-12-02 NOTE — Telephone Encounter (Signed)
Called patient re no-show.  He said he was unaware of the appointment and asked to reschedule.  I reviewed the no-show policy with him, and he is aware of the charge.  Rescheduled for 12/07/17.

## 2017-12-07 ENCOUNTER — Ambulatory Visit (INDEPENDENT_AMBULATORY_CARE_PROVIDER_SITE_OTHER): Payer: 59 | Admitting: Family

## 2017-12-07 ENCOUNTER — Encounter: Payer: Self-pay | Admitting: Family

## 2017-12-07 VITALS — BP 112/78 | HR 76 | Resp 16 | Ht 74.0 in | Wt 191.4 lb

## 2017-12-07 DIAGNOSIS — Z7189 Other specified counseling: Secondary | ICD-10-CM | POA: Diagnosis not present

## 2017-12-07 DIAGNOSIS — F902 Attention-deficit hyperactivity disorder, combined type: Secondary | ICD-10-CM | POA: Diagnosis not present

## 2017-12-07 DIAGNOSIS — F411 Generalized anxiety disorder: Secondary | ICD-10-CM

## 2017-12-07 DIAGNOSIS — Z719 Counseling, unspecified: Secondary | ICD-10-CM | POA: Diagnosis not present

## 2017-12-07 DIAGNOSIS — Z79899 Other long term (current) drug therapy: Secondary | ICD-10-CM

## 2017-12-07 MED ORDER — SERTRALINE HCL 100 MG PO TABS: 100.0000 mg | ORAL_TABLET | Freq: Every day | ORAL | 2 refills | Status: DC

## 2017-12-07 NOTE — Progress Notes (Signed)
[facility] [facility] [place] [facility] [address], [address] [place] [address] Dept: [phone] Dept Fax: [phone] Loc: [phone] Loc Fax: [phone]  Medication Check  Patient ID: Ian [name], male  DOB: 1996/03/26, 22 y.o.  MRN: [id number]  Date of Evaluation:12/07/2017  PCP: Tracey HarriesBouska, David, MD  Accompanied by: Self Patient Lives with: Self in apartment with 2 cats.   HISTORY/CURRENT STATUS: HPI  Patient here for routine follow up related to Anxiety, ADHD, and medication management. Patient here by himself and interactive with provider. 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 Patient doing well on Zoloft   EDUCATION: School: [employer]            Year/Grade: Graduated recently  Performance/Grades: average Services: Other: Help as needed in an academic center Activities/Exercise: intermittently, not much Working: Currently working with his mother regularly and [employer] Activities/ Exercise: intermittently  MEDICAL HISTORY: Appetite: Good  MVI/Other: None  Fruits/Vegs: Some Calcium: Variety mg  Iron: Some  Sleep: Bedtime: 12-2:00 am  Awakens: 8-10:00 am  Concerns: Initiation/Maintenance/Other: Initiation problems  Individual Medical History/ Review of Systems: Changes? :None recently. Currently has a split lip with cold. No sicknesses or illnesses.   Allergies: Morphine and related  Current Medications:  Current Outpatient Medications:  .  sertraline (ZOLOFT) 100 MG tablet, Take 1 tablet (100 mg total) by mouth daily., Disp: 30 tablet, Rfl: 2 Medication Side Effects: None  Family Medical/ Social History: Changes? None reported recently. Mother had surgery for removal of scar tissue and pain management.   MENTAL HEALTH: Mental Health  Issues: Anxiety-under control now with no issues. Patient reports no suicidal thoughts or ideations with Zoloft.   PHYSICAL EXAM; Vitals:  Vitals:   12/07/17 0943  BP: 112/78  Pulse: 76  Resp: 16  Weight: 191 lb 6.4 oz (86.8 kg)  Height: 6\' 2"  (1.88 m)   Physical Exam  Constitutional: He is oriented to person, place, and time. He appears well-developed and well-nourished.  HENT:  Head: Normocephalic and atraumatic.  Right Ear: External ear normal.  Left Ear: External ear normal.  Nose: Nose normal.  Mouth/Throat: Oropharynx is clear and moist.  Eyes: Pupils are equal, round, and reactive to light. Conjunctivae and EOM are normal.  Neck: Trachea normal, normal range of motion and full passive range of motion without pain. Neck supple.  Cardiovascular: Normal rate, regular rhythm, normal heart sounds and intact distal pulses.  Pulmonary/Chest: Effort normal and breath sounds normal.  Abdominal: Soft. Bowel sounds are normal.  Genitourinary:  Genitourinary Comments: Deferred  Musculoskeletal: Normal range of motion.  Neurological: He is alert and oriented to person, place, and time. He has normal reflexes.  Skin: Skin is warm, dry and intact. Capillary refill takes less than 2 seconds.  Psychiatric: He has a normal mood and affect. His behavior is normal. Judgment and thought content normal.  Vitals reviewed.  General Physical Exam: Unchanged from previous exam, date:08/31/17 Changed:None recently.   Testing/Developmental Screens:  ASRS-  DIAGNOSES:    ICD-10-CM   1. Generalized anxiety disorder F41.1   2. ADHD (attention deficit hyperactivity disorder), combined type F90.2   3. Patient counseled Z71.9   4. Medication management Z79.899   5. Goals of care, counseling/discussion Z71.89     RECOMMENDATIONS: 3 month follow up and continuation with medication. Counseled on  medication management. Zoloft 100 mg daily, # 30 with 2 RF's.   RX for above e-scribed and sent to  pharmacy on record  [id number] [place], [place] - [address] AT [facility] OF [facility] & [address] [place] [address] Phone: [phone] Fax: [phone]  Counseling at this visit included the review of old records and/or current chart with the patient.   Discussed recent history and today's examination with patient   Counseled regarding job seeking and interview process with guidance given.   Recommended a high protein, low sugar and preservatives diet for ADHD patient with increased calories at each meal with good food options.   Encourage calorie dense foods when hungry. Encourage snacks in the afternoon/evening. Discussed increasing calories of foods with butter, sour cream, mayonnaise, cheese or ranch dressing. Can add potato flakes or powdered milk.   Discussed current work progress and continued interviews for engineering jobs.   Counseled medication administration, effects, and possible side effects.  ADHD medications discussed to include different medications and pharmacologic properties of each. Recommendation for specific medication to include dose, administration, expected effects, possible side effects and the risk to benefit ratio of medication management. Zoloft 100 mg daily with no side effects.   Advised importance of:  Good sleep hygiene (8- 10 hours per night) Limited screen time (none on school nights, no more than 2 hours on weekends) Regular exercise(outside and active play) Healthy eating (drink water, no sodas/sweet tea, limit portions and no seconds).   Directed patient to f/u with PCP yearly, dentist every 6 months, MVI to start, regular exercise, better food options daily and good sleep schedule.   NEXT APPOINTMENT: Return in about 3 months (around 03/09/2018) for follow up visit.   More than 50% of the appointment was spent counseling and discussing diagnosis and management of symptoms with the patient and  family.  Carron Curie, NP Counseling Time: 30 mins Total Contact Time: 40 mins

## 2018-01-12 ENCOUNTER — Other Ambulatory Visit: Payer: Self-pay | Admitting: Family

## 2018-01-13 NOTE — Telephone Encounter (Signed)
Zoloft 100 mg daily, # 30 with 2 RF's. RX for above e-scribed and sent to pharmacy on record  [facility] [address] [place], [place] - [address] AT [facility] OF [facility] & [address] [place] [address] Phone: [phone] Fax: [phone]

## 2018-03-07 ENCOUNTER — Ambulatory Visit: Payer: 59 | Admitting: Family

## 2018-03-07 ENCOUNTER — Encounter: Payer: Self-pay | Admitting: Family

## 2018-03-07 VITALS — BP 108/78 | HR 68 | Resp 16 | Ht 74.0 in | Wt 187.4 lb

## 2018-03-07 DIAGNOSIS — Z79899 Other long term (current) drug therapy: Secondary | ICD-10-CM

## 2018-03-07 DIAGNOSIS — R4681 Obsessive-compulsive behavior: Secondary | ICD-10-CM | POA: Diagnosis not present

## 2018-03-07 DIAGNOSIS — Z719 Counseling, unspecified: Secondary | ICD-10-CM

## 2018-03-07 DIAGNOSIS — Z7189 Other specified counseling: Secondary | ICD-10-CM

## 2018-03-07 DIAGNOSIS — F902 Attention-deficit hyperactivity disorder, combined type: Secondary | ICD-10-CM | POA: Diagnosis not present

## 2018-03-07 DIAGNOSIS — F411 Generalized anxiety disorder: Secondary | ICD-10-CM | POA: Diagnosis not present

## 2018-03-07 MED ORDER — SERTRALINE HCL 100 MG PO TABS: ORAL_TABLET | ORAL | 2 refills | Status: DC

## 2018-03-07 NOTE — Progress Notes (Signed)
[place] [facility] [place] [facility] [address], [place] [place] [address] Dept: [phone] Dept Fax: [phone] Loc: [phone] Loc Fax: [phone]  Medical Follow-up  Patient ID: Ian [name], male  DOB: 01-29-1996, 22 y.o.  MRN: [id number]  Date of Evaluation: 03/07/2018  PCP: Tracey Harries, MD  Accompanied by: Self Patient Lives with: Self and cats  HISTORY/CURRENT STATUS:  HPI  Patient here for routine follow up related to ADHD, Anxiety, OCD features, and medication management. Patient here by himself for today's visit and interactive with provider. Patient recently started new job and loving where he works. Ian [name] has a positive outlook and feels this is a great opportunity for advancement in the workplace. Patient with less anxiety and well controlled symptoms of anxiety with Zoloft 100 mg with no side effects.   EDUCATION/Work: Work: Investment banker, operational started a few months ago.  Hours: full-time 11-8 pm  Activities/Exercise: work if physically active and lifting heavy objects to move  MEDICAL HISTORY: Appetite: Better with healthier options recently. MVI/Other: None Fruits/Vegs:Some Calcium: Good amount Iron:Variety  Sleep: Bedtime: 1:00 am most nights Awakens: 9-9:30 ish am  Sleep Concerns: Initiation/Maintenance/Other: occasional sleep initiation issues.   Individual Medical History/Review of System Changes? None recently. No sicknesses or illnesses recently.   Allergies: Morphine and related  Current Medications:  Current Outpatient Medications:  .  sertraline (ZOLOFT) 100 MG tablet, TAKE 1 TABLET(100 MG) BY MOUTH DAILY, Disp: 30 tablet, Rfl: 2 Medication Side Effects: None  Family Medical/Social History Changes?: None reported by patient.   MENTAL HEALTH: Mental Health Issues: Anxiety-less and more in control with Zoloft 100 mg. No suicidal  thoughts or ideations per patient.   PHYSICAL EXAM: Vitals:  Today's Vitals   03/07/18 0910  BP: 108/78  Pulse: 68  Resp: 16  Weight: 187 lb 6.4 oz (85 kg)  Height: 6\' 2"  (1.88 m)  PainSc: 0-No pain  , Facility age limit for growth percentiles is 20 years.  General Exam: Physical Exam  Constitutional: He is oriented to person, place, and time. He appears well-developed and well-nourished.  HENT:  Head: Normocephalic and atraumatic.  Right Ear: External ear normal.  Left Ear: External ear normal.  Nose: Nose normal.  Mouth/Throat: Oropharynx is clear and moist.  Eyes: Pupils are equal, round, and reactive to light. Conjunctivae and EOM are normal.  Neck: Trachea normal, normal range of motion and full passive range of motion without pain. Neck supple.  Cardiovascular: Normal rate, regular rhythm, normal heart sounds and intact distal pulses.  Pulmonary/Chest: Effort normal and breath sounds normal.  Abdominal: Soft. Bowel sounds are normal.  Genitourinary:  Genitourinary Comments: Deferred  Musculoskeletal: Normal range of motion.  Neurological: He is alert and oriented to person, place, and time. He has normal reflexes.  Skin: Skin is warm, dry and intact. Capillary refill takes less than 2 seconds.  Psychiatric: He has a normal mood and affect. His behavior is normal. Judgment and thought content normal.  Vitals reviewed.  Review of Systems  All other systems reviewed and are negative.  Patient here with no concerns for toileting. Daily stool, no constipation or diarrhea. Void urine no difficulty. No enuresis.   Participate in daily oral hygiene to include brushing and flossing.  Neurological: oriented to time, place, and person Cranial Nerves: normal  Neuromuscular:  Motor Mass: Normal  Tone: Normal  Strength: Normal  DTRs: 2+ and symmetric Overflow: None Reflexes: no tremors noted  Sensory Exam: Vibratory: Intact  Fine Touch: Intact  Testing/Developmental  Screens:  ASRS-14/15 scored by patient and counseled at today's visit  DIAGNOSES:    ICD-10-CM   1. ADHD (attention deficit hyperactivity disorder), combined type F90.2   2. Generalized anxiety disorder F41.1 sertraline (ZOLOFT) 100 MG tablet  3. Medication management Z79.899   4. Patient counseled Z71.9   5. Obsessive-compulsive behavior R46.81   6. Goals of care, counseling/discussion Z71.89     RECOMMENDATIONS: 3 month follow up and continuation of medication. Counseled on medication management of Zoloft 100 mg daily, # 30 with 2 RF"s.  RX for above e-scribed and sent to pharmacy on record  [facility] [id number] [place], [place] - [address] AT [facility] OF [facility] & [address] [place] [address] Phone: [phone] Fax: [phone]  Counseling at this visit included the review of old records and/or current chart with the patient with updates given from last f/u visit.   Discussed recent history and today's examination with patient with no changes on examination today.   Counseled regarding anticipatory guidance for work and advancements for his career.   Recommended a high protein, low sugar diet for ADHD, watch portion sizes, avoid second helpings, avoid sugary snacks and drinks, drink more water, eat more fruits and vegetables, increase daily exercise.  Discussed work and behavioral progress and advocated for appropriate need for work environment to be successful.   Maintain Structure, routine, organization, reward, motivation and consequences with work and home life.   Counseled medication administration, effects, and possible side effects of Zoloft.   Advised importance of:  Good sleep hygiene (8- 10 hours per night) Limited screen time (none on school nights, no more than 2 hours on weekends) Regular exercise(outside and active play) Healthy eating (drink water, no sodas/sweet tea, limit portions and no seconds).   Directed  patient to f/u with PCP yearly, dentist every 6 months, MVI daily, healthy eating habits to continue and physical activity, and reviewed good sleep hygiene.   NEXT APPOINTMENT: Return in about 3 months (around 06/07/2018) for follow up visit.  More than 50% of the appointment was spent counseling and discussing diagnosis and management of symptoms with the patient and family.  Carron Curieawn M Paretta-Leahey, NP Counseling Time: 30 mins Total Contact Time: 40 mins

## 2018-12-30 ENCOUNTER — Other Ambulatory Visit: Payer: Self-pay | Admitting: Family

## 2018-12-30 DIAGNOSIS — F411 Generalized anxiety disorder: Secondary | ICD-10-CM

## 2019-01-01 NOTE — Telephone Encounter (Signed)
Last visit 03/07/2018 next visit 01/09/2019

## 2019-01-01 NOTE — Telephone Encounter (Signed)
Upcoming appt 4/29, will authorize 1 months E-Prescribed Zoloft directly to  [id number] [place], [place] - [address] AT [facility] OF [facility] & [address] [place] [address] Phone: [phone] Fax: [phone]

## 2019-01-09 ENCOUNTER — Other Ambulatory Visit: Payer: Self-pay

## 2019-01-09 ENCOUNTER — Ambulatory Visit (INDEPENDENT_AMBULATORY_CARE_PROVIDER_SITE_OTHER): Payer: 59 | Admitting: Family

## 2019-01-09 ENCOUNTER — Encounter: Payer: Self-pay | Admitting: Family

## 2019-01-09 DIAGNOSIS — F411 Generalized anxiety disorder: Secondary | ICD-10-CM

## 2019-01-09 DIAGNOSIS — Z79899 Other long term (current) drug therapy: Secondary | ICD-10-CM

## 2019-01-09 DIAGNOSIS — R278 Other lack of coordination: Secondary | ICD-10-CM

## 2019-01-09 DIAGNOSIS — F902 Attention-deficit hyperactivity disorder, combined type: Secondary | ICD-10-CM

## 2019-01-09 DIAGNOSIS — Z7189 Other specified counseling: Secondary | ICD-10-CM

## 2019-01-09 DIAGNOSIS — Z719 Counseling, unspecified: Secondary | ICD-10-CM

## 2019-01-09 MED ORDER — SERTRALINE HCL 100 MG PO TABS: ORAL_TABLET | ORAL | 2 refills | Status: DC

## 2019-01-09 NOTE — Progress Notes (Signed)
Patient ID: Ian [name], male   DOB: 10-Dec-1995, 23 y.o.   MRN: [id number]  [place] [facility] [address], [address] [place] [address] Dept: [phone] Dept Fax: [phone]  Medication Check visit via Virtual Video due to COVID-19  Patient ID:  Ian [name]  male DOB: 10-Dec-1995   23 y.o.   MRN: [id number]   DATE:01/09/19  PCP: Tracey HarriesBouska, David, MD  Virtual Visit via Video Note  I connected with  Ian HallsBryan M [name] on his cell phone [phone] on 01/09/19 at 11:30 AM EDT by a video enabled telemedicine application and verified that I am speaking with the correct person using two identifiers. Patient Location: at work.   I discussed the limitations, risks, security and privacy concerns of performing an evaluation and management service by telephone and the availability of in person appointments. I also discussed with the patient that there may be a patient responsible charge related to this service. The patient expressed understanding and agreed to proceed.  Provider: Carron Curieawn M Paretta-Leahey, NP  Location: private location  HISTORY/CURRENT STATUS: Ian [name] is here for medication management of the psychoactive medications for ADHD, Anxiety, and review of educational, work,  and behavioral concerns.   Ian GrieveBryan currently taking Zoloft 100 mg daily  which is working well. Takes medication at 10-12:00 pm. Medication is good and controlling is sysmptoms. Ian GrieveBryan is able to focus through work.   Ian GrieveBryan is eating well (eating breakfast, lunch and dinner). Maintaining good amount of food with weight.  Sleeping well (goes to bed at 9-10:00 pm wakes at 7-8:00 am), sleeping through the night. Most nights is good but on occasion will have some issues with sleeping.   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   Ian GrieveBryan is currently out of school due to social distancing due to COVID-19 and continuing to work.   Activities/ Exercise: intermittently  Screen time: (phone, tablet, TV, computer): TV, computer and phone  MEDICAL HISTORY: Individual Medical History/ Review of Systems: Changes? :None   Family Medical/ Social History: Changes? None recently reported Patient Lives with: no roommates  Current Medications:  Outpatient Encounter Medications as of 01/09/2019  Medication Sig  . sertraline (ZOLOFT) 100 MG tablet Take one tablet daily by mouth.  . [DISCONTINUED] sertraline (ZOLOFT) 100 MG tablet TAKE 1 TABLET(100 MG) BY MOUTH DAILY   No facility-administered encounter medications on file as of 01/09/2019.    Medication Side Effects: None  MENTAL HEALTH: Mental Health Issues:   Anxiety  Zoloft 100 mg daily with good symptom control. Ian GrieveBryan denies thoughts of hurting self or others, denies depression, anxiety, or fears.   DIAGNOSES:    ICD-10-CM   1. Generalized anxiety disorder F41.1 sertraline (ZOLOFT) 100 MG tablet  2. ADHD (attention deficit hyperactivity disorder), combined type F90.2   3. Dysgraphia R27.8   4. Medication management Z79.899   5. Patient counseled Z71.9   6. Goals of care, counseling/discussion Z71.89     RECOMMENDATIONS:  Discussed recent history with patient since last f/u with any changes medically or job.   Discussed work and job searching progress amongst the quarantine due to COVID-19 restrictions.   Discussed continued need for routine, structure, and motivation with working 2 jobs and looking for long term employments.  Encouraged recommended limitations on TV, tablets, phones, video games and computers for non-educational  activities.   Discussed need for bedtime routine, use of good sleep hygiene, no video games, TV or phones for an hour before bedtime.   Encouraged physical activity and outdoor exercise, maintaining social distancing.  Active with working in the warehouse now.   Counseled medication pharmacokinetics, options, dosage, administration, desired effects, and possible side effects.   Zoloft 100 mg daily, # 30 with 2 RF's. RX for above e-scribed and sent to pharmacy on record  [id number] [place], [place] - [address] AT [facility] OF [facility] & [address] [place] [address] Phone: [phone] Fax: [phone]  I discussed the assessment and treatment plan with the patient. The patient was provided an opportunity to ask questions and all were answered. The patient agreed with the plan and demonstrated an understanding of the instructions.   I provided 25 minutes of non-face-to-face time during this encounter.   Completed record review for 10 minutes prior to the virtual video visit.   NEXT APPOINTMENT:  Return in about 3 months (around 04/10/2019) for follow up visit.  The patient was advised to call back or seek an in-person evaluation if the symptoms worsen or if the condition fails to improve as anticipated.  Medical Decision-making: More than 50% of the appointment was spent counseling and discussing diagnosis and management of symptoms with the patient and family.  Carron Curie, NP

## 2019-04-17 ENCOUNTER — Ambulatory Visit (INDEPENDENT_AMBULATORY_CARE_PROVIDER_SITE_OTHER): Payer: 59 | Admitting: Family

## 2019-04-17 ENCOUNTER — Encounter: Payer: Self-pay | Admitting: Family

## 2019-04-17 ENCOUNTER — Other Ambulatory Visit: Payer: Self-pay

## 2019-04-17 VITALS — BP 110/68 | HR 68 | Temp 96.8°F | Resp 16 | Ht 73.75 in | Wt 210.2 lb

## 2019-04-17 DIAGNOSIS — Z79899 Other long term (current) drug therapy: Secondary | ICD-10-CM

## 2019-04-17 DIAGNOSIS — Z719 Counseling, unspecified: Secondary | ICD-10-CM | POA: Diagnosis not present

## 2019-04-17 DIAGNOSIS — F902 Attention-deficit hyperactivity disorder, combined type: Secondary | ICD-10-CM | POA: Diagnosis not present

## 2019-04-17 DIAGNOSIS — F411 Generalized anxiety disorder: Secondary | ICD-10-CM | POA: Diagnosis not present

## 2019-04-17 MED ORDER — SERTRALINE HCL 100 MG PO TABS: ORAL_TABLET | ORAL | 2 refills | Status: DC

## 2019-04-17 NOTE — Progress Notes (Signed)
Medical Follow-up  Patient ID: Ian [name]  DOB: [birth year]  MRN: [id number]  DATE:04/17/19 Ian Limbo, MD  Accompanied by: Patient Patient Lives with: himself in a house that be bought in [place] with cats.  HISTORY/CURRENT STATUS: HPI Patient here by himself for today's visit. Patient has continued to work full-time. He is looking for alternative work position outside of the current company, but this is on hold due to COVID-19. Recently bought a house and living with his cats in [place]. No medication changes. Zoloft is working well to control his anxiety with no significant changes. Ian [name] does report some anxiety with unknown due to viral outbreak, but this has calmed down.   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   Activities: intermittently with working  Screen Time: minimal time  Driving: daily with no issues  MEDICAL HISTORY: Appetite: Good with better choices of food  Sleep: Bedtime: 12:00 am most nights Awakens: 4:30 am Sleep Concerns: minimal hours of sleep due to his schedule  Allergies:  Allergies  Allergen Reactions  . Morphine And Related Itching   Current Medications:  Zoloft 100 mg daily with no side effects, no suicidal thoughts and ideations Medication Side Effects: None  Individual Medical History/Review of System Changes? None  Family Medical/Social History Changes?: None reported by patient  MENTAL HEALTH: Mental Health Issues:  Denies sadness, loneliness or depression. No self harm or thoughts of self harm or injury. Denies fears, worries and anxieties. Has good peer relations and is not a bully nor is victimized.  ROS: Review of Systems  Psychiatric/Behavioral: The patient is nervous/anxious.   All other systems reviewed and are negative.  PHYSICAL EXAM: Vitals:   04/17/19 0917  Resp: 16  Temp: (!)  96.8 F (36 C)  TempSrc: Temporal  Weight: 210 lb 3.2 oz (95.3 kg)  Height: 6' 1.75" (1.873 m)   Body mass index is 27.17 kg/m.  General Exam: Physical Exam Vitals signs reviewed.  Constitutional:      Appearance: He is well-developed.  HENT:     Head: Normocephalic and atraumatic.     Right Ear: External ear normal.     Left Ear: External ear normal.     Nose: Nose normal.  Eyes:     Conjunctiva/sclera: Conjunctivae normal.     Pupils: Pupils are equal, round, and reactive to light.  Neck:     Musculoskeletal: Full passive range of motion without pain, normal range of motion and neck supple.     Trachea: Trachea normal.  Cardiovascular:     Rate and Rhythm: Normal rate and regular rhythm.     Heart sounds: Normal heart sounds.  Pulmonary:     Effort: Pulmonary effort is normal.     Breath sounds: Normal breath sounds.  Abdominal:     General: Bowel sounds are normal.     Palpations: Abdomen is soft.  Musculoskeletal: Normal range of motion.  Skin:    General: Skin is warm and dry.     Capillary Refill: Capillary refill takes less than 2 seconds.  Neurological:     General: No focal deficit present.     Mental Status: He is alert and oriented to person, place, and time. Mental status is at baseline.     Deep Tendon Reflexes: Reflexes are normal and symmetric.  Psychiatric:        Mood and Affect: Mood  normal.        Behavior: Behavior normal.        Thought Content: Thought content normal.        Judgment: Judgment normal.   Neurological: oriented to time, place, and person  Testing/Developmental Screens:  Not completed at today's visit Reviewed with patient his current concerns  DIAGNOSES:    ICD-10-CM   1. ADHD (attention deficit hyperactivity disorder), combined type  F90.2   2. Generalized anxiety disorder  F41.1 sertraline (ZOLOFT) 100 MG tablet  3. Patient counseled  Z71.9   4. Medication management  Z79.899    RECOMMENDATIONS:  Patient to continue  with Zoloft 100 mg daily, # 30 with 2 RF's. RX for above e-scribed and sent to pharmacy on record  [id number] [address]- [place], [place] - [address] [facility] [address] [place] [address] Phone: [phone] Fax: [phone]  Continue medication as directed.  Counseling at this visit included the review of old records and/or current chart with the patient.  Discussed recent history and today's examination with patient with updates since last f/u visit.   Recommended a high protein, low sugar diet, watch portion sizes, avoid second helpings, avoid sugary snacks and drinks, drink more water, eat more fruits and vegetables, increase daily exercise.  Discussed work progress and advocated for appropriate position based on experience along with education.  Discussed importance of maintaining structure, routine, organization, reward, and motivation with current job along with continuation of alternative work.  Counseled medication pharmacokinetics, options, dosage, administration, desired effects, and possible side effects.    Advised importance of:  Good sleep hygiene (8- 10 hours per night, no TV or video games for 1 hour before bedtime) Limited screen time (none on school nights, no more than 2 hours/day on weekends, use of screen time for motivation) Regular exercise(outside and active play) Healthy eating (drink water or milk, no sodas/sweet tea, limit portions and no seconds).   Patient verbalized understanding of all topics discussed at today's visit.   NEXT APPOINTMENT: Return in about 3 months (around 07/18/2019) for follow up visit.  Medical Decision-making: More than 50% of the appointment was spent counseling and discussing diagnosis and management of symptoms with the patient and family.  I discussed the assessment and treatment plan with the parent. The parent was provided an opportunity to ask questions and all were  answered. The parent agreed with the plan and demonstrated an understanding of the instructions.   The parent was advised to call back or seek an in-person evaluation if the symptoms worsen or if the condition fails to improve as anticipated.  Counseling Time: 40 minutes Total Contact Time: 50 minutes

## 2019-08-29 ENCOUNTER — Encounter: Payer: 59 | Admitting: Family

## 2019-08-29 ENCOUNTER — Other Ambulatory Visit: Payer: Self-pay | Admitting: Family

## 2019-08-29 DIAGNOSIS — F411 Generalized anxiety disorder: Secondary | ICD-10-CM

## 2019-08-30 NOTE — Progress Notes (Signed)
This encounter was created in error - please disregard.

## 2019-08-31 ENCOUNTER — Encounter: Payer: Self-pay | Admitting: Family

## 2019-08-31 ENCOUNTER — Ambulatory Visit (INDEPENDENT_AMBULATORY_CARE_PROVIDER_SITE_OTHER): Payer: 59 | Admitting: Family

## 2019-08-31 DIAGNOSIS — F902 Attention-deficit hyperactivity disorder, combined type: Secondary | ICD-10-CM | POA: Diagnosis not present

## 2019-08-31 DIAGNOSIS — F411 Generalized anxiety disorder: Secondary | ICD-10-CM | POA: Diagnosis not present

## 2019-08-31 DIAGNOSIS — Z79899 Other long term (current) drug therapy: Secondary | ICD-10-CM

## 2019-08-31 DIAGNOSIS — Z719 Counseling, unspecified: Secondary | ICD-10-CM | POA: Diagnosis not present

## 2019-08-31 MED ORDER — SERTRALINE HCL 100 MG PO TABS: ORAL_TABLET | ORAL | 2 refills | Status: DC

## 2019-08-31 NOTE — Progress Notes (Signed)
[facility]. [address] [place] [address] Dept: [phone] Dept Fax: [phone]  Medication Check visit via Virtual Video due to COVID-19  Patient ID:  Ian [name]  male DOB: Jan 28, 1996   23 y.o.   MRN: [id number]   DATE:08/31/19  PCP: Bernerd Limbo, MD  Virtual Visit via Telephone Note Contacted  Ian [name] on 08/31/19 at  3:00 PM EST by telephone and verified that I am speaking with the correct person using two identifiers. Patient/Parent Location: at work  I discussed the limitations, risks, security and privacy concerns of performing an evaluation and management service by telephone and the availability of in person appointments. I also discussed with the parents that there may be a patient responsible charge related to this service. The parents expressed understanding and agreed to proceed.  Provider: Carolann Littler, NP  Location: private location  HISTORY/CURRENT STATUS: Ian [name] is here for medication management of the psychoactive medications for ADHD and review of educational and behavioral concerns.   Ian [name] currently taking Zoloft 100 mg daily, which is working well. Takes medication daily in the morning. Medication tends to last for the time needed. Ian [name] is able to focus through work.   Ian [name] is eating well (eating breakfast, lunch and dinner). No recent changes reported.   Sleeping well (getting plenty of sleep), sleeping through the night.   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   Activities/ Exercise: intermittley with work   Screen time: (phone, tablet, TV, computer): minimal   MEDICAL HISTORY: Individual Medical History/ Review of Systems: Changes? :Yes, see therapist recently   Family Medical/ Social History: Changes? None  Patient Lives with: self in a house he bought  Current Medications:    Current Outpatient Medications  Medication Instructions  . sertraline (ZOLOFT) 100 MG tablet Take one tablet daily by mouth.   Medication Side Effects: None  MENTAL HEALTH: Mental Health Issues:   Anxiety better with Zoloft and able to be safe, now seeing a counselor.   DIAGNOSES:    ICD-10-CM   1. ADHD (attention deficit hyperactivity disorder), combined type  F90.2   2. Generalized anxiety disorder  F41.1 sertraline (ZOLOFT) 100 MG tablet  3. Medication management  Z79.899   4. Patient counseled  Z71.9    RECOMMENDATIONS:  Discussed recent history with patient with updates for work, health and medication management.   Discussed work progress and recommended continued accommodations for the new school year.  Encouraged Ian [name] to continue with counseling as needed.   Discussed continued need for structure, routine, reward (external), motivation (internal), positive reinforcement, consequences, and organization with home and work settings.   Encouraged recommended limitations on TV, tablets, phones, video games and computers for non-educational activities.   Discussed need for bedtime routine, use of good sleep hygiene, no video games, TV or phones for an hour before bedtime.   Encouraged physical activity and outdoor play, maintaining social distancing.   Counseled medication pharmacokinetics, options, dosage, administration, desired effects, and possible side effects.   Zoloft 100 mg daily, # 30 with 2 RF's. RX for above e-scribed and sent to pharmacy on record  [id number], [place] [address] Phone: [phone] Fax: [phone]  I discussed the assessment and treatment plan with the patient & parent. The patient &  parent was provided an opportunity to ask questions and all were answered. The patient & parent agreed with the plan and demonstrated an  understanding of the instructions.   I provided 25 minutes of non-face-to-face time during this encounter.   Completed record review for 10 minutes prior to the virtual telehealth visit.   NEXT APPOINTMENT:  Return in about 3 months (around 11/29/2019) for follow up visit.  The patient & parent was advised to call back or seek an in-person evaluation if the symptoms worsen or if the condition fails to improve as anticipated.  Medical Decision-making: More than 50% of the appointment was spent counseling and discussing diagnosis and management of symptoms with the patient and family.  Carron Curie, NP

## 2019-11-30 ENCOUNTER — Ambulatory Visit (INDEPENDENT_AMBULATORY_CARE_PROVIDER_SITE_OTHER): Payer: 59 | Admitting: Family

## 2019-11-30 ENCOUNTER — Encounter: Payer: Self-pay | Admitting: Family

## 2019-11-30 DIAGNOSIS — F411 Generalized anxiety disorder: Secondary | ICD-10-CM | POA: Diagnosis not present

## 2019-11-30 DIAGNOSIS — R278 Other lack of coordination: Secondary | ICD-10-CM | POA: Diagnosis not present

## 2019-11-30 DIAGNOSIS — Z719 Counseling, unspecified: Secondary | ICD-10-CM

## 2019-11-30 DIAGNOSIS — Z79899 Other long term (current) drug therapy: Secondary | ICD-10-CM

## 2019-11-30 DIAGNOSIS — F902 Attention-deficit hyperactivity disorder, combined type: Secondary | ICD-10-CM | POA: Diagnosis not present

## 2019-11-30 DIAGNOSIS — Z7189 Other specified counseling: Secondary | ICD-10-CM

## 2019-11-30 MED ORDER — SERTRALINE HCL 100 MG PO TABS: ORAL_TABLET | ORAL | 2 refills | Status: DC

## 2019-11-30 NOTE — Progress Notes (Signed)
[facility]. [address] [place] [address] Dept: [phone] Dept Fax: [phone]  Medication Check visit via Virtual Video due to COVID-19  Patient ID:  Ian [name]  male DOB: [birth year]   24 y.o.   MRN: [id number]   DATE:11/30/19  PCP: Bernerd Limbo, MD  Virtual Visit via Video Note  I connected with  SWAIN ACREE on 11/30/19 at  8:00 AM EDT by a video enabled telemedicine application and verified that I am speaking with the correct person using two identifiers. Patient/Parent Location: at home   I discussed the limitations, risks, security and privacy concerns of performing an evaluation and management service by telephone and the availability of in person appointments. I also discussed with the parents that there may be a patient responsible charge related to this service. The parents expressed understanding and agreed to proceed.  Provider: Carolann Littler, NP  Location: private location  HISTORY/CURRENT STATUS: Ian [name] is here for medication management of the psychoactive medications for ADHD and review of educational and behavioral concerns.   Elroy currently taking Zoloft daily, which is working well. Takes medication at night before bedtime . Medication tends to last all day and controlling symptoms.  Ambrose is able to focus through work.   Croix is eating well (eating breakfast, lunch and dinner). Eating with no changes or concerns.   Sleeping well (getting enough sleep most nights), sleeping through the night. Recently having waking issues with the Time changes last weekend.   EDUCATION/WORK: Work: [employer]: daily with 8 hours and no issue.   Activities/ Exercise: intermittley with work   Screen time: (phone, tablet, TV, computer): computer for work, TV and movies, phone  MEDICAL HISTORY: [place] History/ Review of Systems: Changes? :None. Did  get COVID-19 test with negative results.   Family Medical/ Social History: Changes? Yes, Adela Ports with recent health issues, had radiation.  Patient Lives with: Cats in his own house  Current Medications:  Current Outpatient Medications  Medication Instructions  . sertraline (ZOLOFT) 100 MG tablet Take one tablet daily by mouth.   Medication Side Effects: None  MENTAL HEALTH: Mental Health Issues:   Anxiety well controlled with Zoloft daily. No suicidal thoughts or ideations.   DIAGNOSES:    ICD-10-CM   1. ADHD (attention deficit hyperactivity disorder), combined type  F90.2   2. Generalized anxiety disorder  F41.1 sertraline (ZOLOFT) 100 MG tablet  3. Dysgraphia  R27.8   4. Medication management  Z79.899   5. Patient counseled  Z71.9     RECOMMENDATIONS:  Discussed recent history with patient with updates foe work, current job responsibilities, Teacher, English as a foreign language, health and medications.   Discussed work progress and recommended continued progress with current job.  Discussed growth and development and current weight. Recommended healthy food choices, watching portion sizes, avoiding second helpings, avoiding sugary drinks like soda and tea, drinking more water, getting more exercise.   Discussed continued need for structure, routine, reward (external), motivation (internal), positive reinforcement, consequences, and organization with home and work settings.   Encouraged recommended limitations on TV, tablets, phones, video games and computers for non-educational activities.   Discussed need for bedtime routine, use of good sleep hygiene, no video games, TV or phones for an hour before bedtime.   Encouraged physical activity and outdoor play, maintaining social distancing.   Counseled medication pharmacokinetics, options, dosage, administration, desired effects, and possible side effects.   Zoloft 100  mg daily, # 30 with 2 RF's. RX for above e-scribed and sent to pharmacy  on record  [facility] #[id number] Ginette Otto, [place] - [address] AT [facility] OF [facility] & [address] [place] [address] Phone: [phone] Fax: [phone]  I discussed the assessment and treatment plan with the patient. The patient was provided an opportunity to ask questions and all were answered. The patient agreed with the plan and demonstrated an understanding of the instructions.   I provided 25 minutes of non-face-to-face time during this encounter.   Completed record review for 10 minutes prior to the virtual video visit.   NEXT APPOINTMENT:  Return in about 3 months (around 03/01/2020) for follow up visit.  The patient was advised to call back or seek an in-person evaluation if the symptoms worsen or if the condition fails to improve as anticipated.  Medical Decision-making: More than 50% of the appointment was spent counseling and discussing diagnosis and management of symptoms with the patient and family.  Carron Curie, NP

## 2020-03-04 ENCOUNTER — Other Ambulatory Visit: Payer: Self-pay

## 2020-03-04 ENCOUNTER — Encounter: Payer: Self-pay | Admitting: Family

## 2020-03-04 ENCOUNTER — Ambulatory Visit (INDEPENDENT_AMBULATORY_CARE_PROVIDER_SITE_OTHER): Payer: 59 | Admitting: Family

## 2020-03-04 VITALS — BP 102/68 | HR 76 | Resp 16 | Ht 74.0 in | Wt 221.4 lb

## 2020-03-04 DIAGNOSIS — Z719 Counseling, unspecified: Secondary | ICD-10-CM

## 2020-03-04 DIAGNOSIS — F902 Attention-deficit hyperactivity disorder, combined type: Secondary | ICD-10-CM | POA: Diagnosis not present

## 2020-03-04 DIAGNOSIS — Z79899 Other long term (current) drug therapy: Secondary | ICD-10-CM

## 2020-03-04 DIAGNOSIS — F411 Generalized anxiety disorder: Secondary | ICD-10-CM

## 2020-03-04 DIAGNOSIS — Z7189 Other specified counseling: Secondary | ICD-10-CM

## 2020-03-04 MED ORDER — SERTRALINE HCL 100 MG PO TABS: ORAL_TABLET | ORAL | 2 refills | Status: DC

## 2020-03-04 NOTE — Progress Notes (Signed)
[facility] [address] [place] [address] Dept: [phone] Dept Fax: [phone] Loc: [phone] Loc Fax: [phone]  Medication Check  Patient ID: Ian [name], male  DOB: [birth year], 24 y.o.  MRN: [id number]  Date of Evaluation: 03/04/2020 PCP: Bernerd Limbo, MD  Accompanied by: self Patient Lives with: self  HISTORY/CURRENT STATUS: HPI Patient here by himself for the visit. Patient interactive and appropriate with the provider today. Patient has continued to work full-time at the [employer] with brother, mother and grandparents. He is currently working on 2 certifications for work that has been stressful, but able to handle the stressors with no real concerns. Lavon has continued to take his Zoloft daily at [facility] with no reported side effects.   EDUCATION/Work: Work: [employer]: daily with 8 hours and no issue Activities/ Exercise: not much, occasional walking  MEDICAL HISTORY: Appetite: Good  MVI/Other: None   Eating well with some variety of foods, mostly the same foods.  Sleep: Getting enough sleep with no issues, most nights getting 6 hours  Concerns: Initiation/Maintenance/Other: None reported  Individual Medical History/ Review of Systems: Changes? :None reported  Allergies: Morphine and related  Current Medications:  Current Outpatient Medications:  .  sertraline (ZOLOFT) 100 MG tablet, Take one tablet daily by mouth., Disp: 30 tablet, Rfl: 2 Medication Side Effects: None  Family Medical/ Social History: Changes? None   MENTAL HEALTH: Mental Health Issues: Anxiety-Zoloft 100 mg daily with good symptom control  PHYSICAL EXAM; Vitals:  Vitals:   03/04/20 0814  BP: 102/68  Pulse: 76  Resp: 16  Height: 6\' 2"  (1.88 m)  Weight: 221 lb 6.4 oz (100.4 kg)  BMI (Calculated): 28.41   General Physical  Exam: Unchanged from previous exam, date: None Changed:None  DIAGNOSES:    ICD-10-CM   1. ADHD (attention deficit hyperactivity disorder), combined type  F90.2   2. Generalized anxiety disorder  F41.1 sertraline (ZOLOFT) 100 MG tablet  3. Medication management  Z79.899   4. Patient counseled  Z71.9   5. Goals of care, counseling/discussion  Z71.89    RECOMMENDATIONS:  Counseling at this visit included the review of old records and/or current chart with the patient with updates for work, home, family, health and medications.   Discussed recent history and today's examination with patient with no changes on exam today.   Counseled regarding work setting, family business, work Training and development officer and schedule daily.   Recommended a high protein, low sugar diet, watch portion sizes, avoid second helpings, avoid sugary snacks and drinks, drink more water, eat more fruits and vegetables, increase daily exercise.  Discussed workprogress and advocated for appropriate accommodations as needed for work with certification exams.   Discussed importance of maintaining structure, routine, organization, reward, motivation and consequences with consistency at home and work environments.   Counseled medication pharmacokinetics, options, dosage, administration, desired effects, and possible side effects.   Zoloft 100 mg daily, # 30 with 2 RF's  RX for above e-scribed and sent to pharmacy on record  [facility][id number] [address], [place]  [address] Phone: [phone] Fax: [phone]  Advised importance of:  Good sleep hygiene (8- 10 hours per night, no TV or video games for 1 hour before bedtime) Limited screen time (none on school nights, no more than 2 hours/day on weekends, use  of screen time for motivation) Regular exercise(outside and active play) Healthy eating (drink water or milk, no sodas/sweet  tea, limit portions and no seconds).   NEXT APPOINTMENT: Return in about 3 months (around 06/04/2020) for f/u visit.  Medical Decision-making: More than 50% of the appointment was spent counseling and discussing diagnosis and management of symptoms with the patient and family.  Carron Curie, NP Counseling Time: 25 mins  Total Contact Time: 30 mins

## 2020-05-06 ENCOUNTER — Other Ambulatory Visit: Payer: Self-pay | Admitting: Family

## 2020-05-06 DIAGNOSIS — F411 Generalized anxiety disorder: Secondary | ICD-10-CM

## 2020-05-06 NOTE — Telephone Encounter (Signed)
Zoloft 100 mg daily, # 30 with 2 RF's.RX for above e-scribed and sent to pharmacy on record  [facility] #[id number] Ginette Otto, [place] - [address] AT [facility] OF [facility] & [address] [place] [address] Phone: [phone] Fax: [phone]

## 2020-06-17 ENCOUNTER — Encounter: Payer: Self-pay | Admitting: Family

## 2020-06-17 ENCOUNTER — Other Ambulatory Visit: Payer: Self-pay

## 2020-06-17 ENCOUNTER — Ambulatory Visit (INDEPENDENT_AMBULATORY_CARE_PROVIDER_SITE_OTHER): Payer: 59 | Admitting: Family

## 2020-06-17 VITALS — BP 122/70 | HR 68 | Resp 16 | Ht 74.0 in | Wt 222.0 lb

## 2020-06-17 DIAGNOSIS — F902 Attention-deficit hyperactivity disorder, combined type: Secondary | ICD-10-CM

## 2020-06-17 DIAGNOSIS — F411 Generalized anxiety disorder: Secondary | ICD-10-CM

## 2020-06-17 DIAGNOSIS — Z7189 Other specified counseling: Secondary | ICD-10-CM

## 2020-06-17 DIAGNOSIS — Z719 Counseling, unspecified: Secondary | ICD-10-CM

## 2020-06-17 DIAGNOSIS — Z79899 Other long term (current) drug therapy: Secondary | ICD-10-CM | POA: Diagnosis not present

## 2020-06-17 NOTE — Progress Notes (Signed)
[facility]  [facility] [address], [address] [place] [address] Dept: [phone] Dept Fax: [phone] Loc: [phone] Loc Fax: [phone]  Medication Check  Patient ID: Ian [name], male  DOB: 1996/03/12, 24 y.o.  MRN: [id number]  Date of Evaluation: 06/17/2020 PCP: Tracey Harries, MD  Accompanied by: self Patient Lives with: self and cats  HISTORY/CURRENT STATUS: HPI Patient here by himself today for the visit. Patient interactive with provider and medical student. 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 Doing well with living on his own with his cats, but recently lost his 23 year old cat Lola. Now with some sleep issues due to loss of cat over the past few weeks. Patient with some increase in anxiety over the loss of the cat but otherwise good symptom control with his Zoloft 100 mg daily.   EDUCATION/WORK: Work: Scientist, product/process development.  Hours: daily with 8 hours and no issue, some pm  Activities/ Exercise: not much, occasional walking. Not much movement at work all day.   MEDICAL HISTORY: Appetite: Good   MVI/Other: None   Some variety of foods with healthier foods with good portion control. Not eating much breakfast most days.   Sleep: Not getting too much recently  Concerns: Initiation/Maintenance/Other: None, cat recently passed away (lola) and only getting about 4 hours most weekdays.   Individual Medical History/ Review of Systems: Changes? :None reported recently. No doctor visits or blood work recently.   Allergies: Morphine and related  Current Medications:  Current Outpatient Medications:  .  sertraline (ZOLOFT) 100 MG tablet, TAKE 1 TABLET BY MOUTH DAILY, Disp: 30 tablet, Rfl: 2 Medication Side Effects: None  Family Medical/ Social History: Changes? Yes, mother with shot for pain today.   MENTAL  HEALTH: Mental Health Issues: Anxiety-well controlled with Zoloft  PHYSICAL EXAM; Vitals:  Vitals:   06/17/20 0818  BP: 122/70  Pulse: 68  Resp: 16  Height: 6\' 2"  (1.88 m)  Weight: 222 lb (100.7 kg)  BMI (Calculated): 28.49   General Physical Exam: Unchanged from previous exam, date:03/04/20 Changed: none from the last visit.   DIAGNOSES:    ICD-10-CM   1. ADHD (attention deficit hyperactivity disorder), combined type  F90.2   2. Generalized anxiety disorder  F41.1   3. Medication management  Z79.899   4. Patient counseled  Z71.9   5. Goals of care, counseling/discussion  Z71.89     RECOMMENDATIONS:  Counseling at this visit included the review of old records and/or current chart with the patient with updates with school, learning, health and medications.   Discussed recent history and today's examination with patient with no changes today.   Counseled regarding exercise and dietary intake with weight loss Will continue to monitor.   Recommended a high protein, low sugar diet, watch portion sizes, avoid second helpings, avoid sugary snacks and drinks, drink more water, eat more fruits and vegetables, increase daily exercise.  Discussed work progress and advocated for appropriate accommodations as needed for work environment.   Discussed importance of maintaining structure, routine, organization, reward, motivation and consequences with consistency with no changes at work or home settings.   Counseled medication pharmacokinetics, options, dosage, administration, desired effects, and possible side effects.   Zoloft 100 mg daily, no Rx today.  Advised importance of:  Good sleep hygiene (8- 10 hours per night, no TV or video games for 1 hour before  bedtime) Limited screen time (none on school nights, no more than 2 hours/day on weekends, use of screen time for motivation) Regular exercise(outside and active play) Healthy eating (drink water or milk, no sodas/sweet tea, limit  portions and no seconds).   NEXT APPOINTMENT: Return in about 3 months (around 09/17/2020) for f/u visit.  Medical Decision-making: More than 50% of the appointment was spent counseling and discussing diagnosis and management of symptoms with the patient and family.  Carron Curie, NP Counseling Time: 35 mins  Total Contact Time: 40 mins

## 2020-08-14 DIAGNOSIS — Z20822 Contact with and (suspected) exposure to covid-19: Secondary | ICD-10-CM | POA: Diagnosis not present

## 2020-09-23 ENCOUNTER — Ambulatory Visit (INDEPENDENT_AMBULATORY_CARE_PROVIDER_SITE_OTHER): Payer: BC Managed Care – PPO | Admitting: Family

## 2020-09-23 ENCOUNTER — Other Ambulatory Visit: Payer: Self-pay

## 2020-09-23 ENCOUNTER — Encounter: Payer: Self-pay | Admitting: Family

## 2020-09-23 VITALS — BP 118/82 | HR 76 | Resp 16 | Ht 74.0 in | Wt 220.4 lb

## 2020-09-23 DIAGNOSIS — R278 Other lack of coordination: Secondary | ICD-10-CM | POA: Diagnosis not present

## 2020-09-23 DIAGNOSIS — F902 Attention-deficit hyperactivity disorder, combined type: Secondary | ICD-10-CM | POA: Diagnosis not present

## 2020-09-23 DIAGNOSIS — Z7189 Other specified counseling: Secondary | ICD-10-CM

## 2020-09-23 DIAGNOSIS — F411 Generalized anxiety disorder: Secondary | ICD-10-CM

## 2020-09-23 DIAGNOSIS — Z79899 Other long term (current) drug therapy: Secondary | ICD-10-CM

## 2020-09-23 DIAGNOSIS — F819 Developmental disorder of scholastic skills, unspecified: Secondary | ICD-10-CM

## 2020-09-23 DIAGNOSIS — Z719 Counseling, unspecified: Secondary | ICD-10-CM

## 2020-09-23 MED ORDER — SERTRALINE HCL 100 MG PO TABS: ORAL_TABLET | ORAL | 2 refills | Status: DC

## 2020-09-23 NOTE — Progress Notes (Signed)
[facility] [facility] [address], [address]  [place] [address] Dept: [phone] Dept Fax: [phone] Loc: [phone] Loc Fax: [phone]  Medication Check  Patient ID: Ian [name], male  DOB: [birth year], 25 y.o.  MRN: [id number]  Date of Evaluation: 09/23/2020  PCP: Tracey Harries, MD  Accompanied by: self Patient Lives with: patient  HISTORY/CURRENT STATUS: HPI Patient here by himself for today's visit. Patient interactive and appropriate with provider. Patient has continued to work at [employer] and doing well. 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 Patient had done well living on his own with his cats. No recent health or medical issues reported. Patient doing better with eating and watching portion sizes. Walking more with work but nothing outside of work. He has continued with his Zoloft 100 mg daily with no side effects or adverse effects reported.   EDUCATION/WORK: Work: Scientist, product/process development.  Hours: daily with 8 hours and no issue, some pm  Activities/ Exercise: not much now with work  MEDICAL HISTORY: Appetite: Better portion control and making an effort to be aware of what he is eating. No current MVI daily or supplements.   Sleep: Bedtime: 12-1:00 am  Awakens: 8:00 am  Concerns: Initiation/Maintenance/Other: None reported by patient.   Individual Medical History/ Review of Systems: Changes? :None reported recently. Did get tested for covid but negative.  Allergies: Morphine and related  Current Medications:  Current Outpatient Medications:  .  sertraline (ZOLOFT) 100 MG tablet, TAKE 1 TABLET BY MOUTH DAILY, Disp: 30 tablet, Rfl: 2 Medication Side Effects: None  Family Medical/ Social History: Changes? None reported by patient today  MENTAL HEALTH: Mental Health Issues: Anxiety-Continued with  Zoloft for symptom control.   PHYSICAL EXAM; Vitals: Vitals:   09/23/20 0846  BP: 118/82  Pulse: 76  Resp: 16  Height: 6\' 2"  (1.88 m)  Weight: 220 lb 6.4 oz (100 kg)  BMI (Calculated): 28.29   General Physical Exam: Unchanged from previous exam, date:last f/u visit  Changed:None   DIAGNOSES:    ICD-10-CM   1. ADHD (attention deficit hyperactivity disorder), combined type  F90.2   2. Generalized anxiety disorder  F41.1 sertraline (ZOLOFT) 100 MG tablet  3. Learning difficulty  F81.9   4. Dysgraphia  R27.8   5. Patient counseled  Z71.9   6. Medication management  Z79.899   7. Goals of care, counseling/discussion  Z71.89    RECOMMENDATIONS: Counseling at this visit included the review of old records and/or current chart with the patient with updates for home, work and social interactions.   Discussed recent history and today's examination with patient with no changes on exam today.   Counseled regarding regarding health and routine follow up care.   Recommended a high protein, low sugar diet, watch portion sizes, avoid second helpings, avoid sugary snacks and drinks, drink more water, eat more fruits and vegetables, increase daily exercise.  Encouraged structure with work and home routine to allow for less daily stressors.   Counseled medication pharmacokinetics, options, dosage, administration, desired effects, and possible side effects.   Zoloft 100 mg daily, # 30 with 2 RF's RX for above e-scribed and sent to pharmacy on record  [id number], [place] - [address] AT [facility] [address] [place] Phone: [phone] Fax: [phone]  Advised importance of:  Good sleep hygiene (  8- 10 hours per night, no TV or video games for 1 hour before bedtime) Limited screen time (none on school nights, no more than 2 hours/day on weekends, use of screen time for motivation) Regular exercise(outside and  active play) Healthy eating (drink water or milk, no sodas/sweet tea, limit portions and no seconds).   NEXT APPOINTMENT: Return in about 3 months (around 12/22/2020) for f/u appt.  Medical Decision-making: More than 50% of the appointment was spent counseling and discussing diagnosis and management of symptoms with the patient and family.  Carron Curie, NP Counseling Time: 25 mins Total Contact Time: 30 mins

## 2021-01-06 ENCOUNTER — Encounter: Payer: Self-pay | Admitting: Family

## 2021-01-06 ENCOUNTER — Ambulatory Visit: Payer: BC Managed Care – PPO | Admitting: Family

## 2021-01-06 ENCOUNTER — Other Ambulatory Visit: Payer: Self-pay

## 2021-01-06 VITALS — BP 128/82 | Resp 16 | Ht 74.0 in | Wt 219.8 lb

## 2021-01-06 DIAGNOSIS — Z79899 Other long term (current) drug therapy: Secondary | ICD-10-CM

## 2021-01-06 DIAGNOSIS — Z719 Counseling, unspecified: Secondary | ICD-10-CM

## 2021-01-06 DIAGNOSIS — F902 Attention-deficit hyperactivity disorder, combined type: Secondary | ICD-10-CM

## 2021-01-06 DIAGNOSIS — Z7189 Other specified counseling: Secondary | ICD-10-CM

## 2021-01-06 DIAGNOSIS — F411 Generalized anxiety disorder: Secondary | ICD-10-CM

## 2021-01-06 MED ORDER — SERTRALINE HCL 100 MG PO TABS: ORAL_TABLET | ORAL | 2 refills | Status: DC

## 2021-01-06 NOTE — Progress Notes (Signed)
[facility] [place] [facility] [address] [place] [address] Dept: [phone] Dept Fax: [phone] Loc: [phone] Loc Fax: [phone]  Medication Check  Patient ID: Ian [name], male  DOB: Feb 06, 1996, 25 y.o.  MRN: [id number]  Date of Evaluation: 01/06/2021 PCP: Ian Harries, MD  Accompanied by: self Patient Lives with: self and cats  HISTORY/CURRENT STATUS: HPI Patient here by himself for the visit today. Interactive and appropriate with provider today. Has continued with same work and schedule with recent changes. 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 No current issues with anxiety and Zoloft is controlling his symptoms.    EDUCATION/WORK: Work: Horticulturist, commercial. Hours: daily with 8 hours and no issue, some pm  Activities/ Exercise: rarely-on occasion will exercise  MEDICAL HISTORY: Appetite: Changed eating habits with healthier diet variety and less soda with more water, not including nuts No current supplements reported  Sleep: 6 hours/night during the work week Concerns: Initiation/Maintenance/Other: On occasion will have some issues with sleeping. Sleeping later on the weekends.   Individual Medical History/ Review of Systems: Changes? :None reported recently. Some increased allergies this year.  Allergies: Morphine and related  Current Medications: Current Outpatient Medications  Medication Instructions  . sertraline (ZOLOFT) 100 MG tablet TAKE 1 TABLET BY MOUTH DAILY   Medication Side Effects: None  Family Medical/ Social History: Changes? Yes, mother fell off horse with right broken orbital floor and rib with increased bruising. Doing better now and back to work.   MENTAL HEALTH: Mental Health Issues: Anxiety-well  controlled with Zoloft   PHYSICAL EXAM; Vitals:  Vitals:   01/06/21 0918  BP: 128/82  Resp: 16  Height: 6\' 2"  (1.88 m)  Weight: 219 lb 12.8 oz (99.7 kg)  BMI (Calculated): 28.21   General Physical Exam: Unchanged from previous exam, date:None  Changed:None  DIAGNOSES:    ICD-10-CM   1. ADHD (attention deficit hyperactivity disorder), combined type  F90.2   2. Generalized anxiety disorder  F41.1 sertraline (ZOLOFT) 100 MG tablet  3. Medication management  Z79.899   4. Patient counseled  Z71.9   5. Goals of care, counseling/discussion  Z71.89    ASSESSMENT: Patient doing well at work with no changes recently. Working on a large project with and taking a Ian [name] for business needs at his job.No issues with job performance or completing tasks. Has reported controlled anxiety with current dose of Zoloft 100 mg daily with good efficacy. No side effects or adverse effects reported by patient. Sleeping as previously with no changes or issues. Eating better with healthy choices. Not currently exercising regularly and looking to establish with PCP for routine care. No changes needed for medication or dosing today.  RECOMMENDATIONS:  Patient provided updates for work, schedule, progress, health and medication since last visit.   Patient with current job performance with no issues completing tasks. 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 He is studying to get his certification in [place] for routine and switches with networks.  Ian [name] has not routinely had PCP visits and discussed establishing with primary care for routine check ups to include blood work. B/P slightly elevated but not WNL and discussed with patient parameters today for B/P readings.  To continued to monitor at home with B/P cuff if available.  Discussed recent changes in dietary intake with controlling portions along with less soda each day. Reviewed  healthy food options and today's B/P with monitoring his sodium intake as well. Drinking more water and eating more fruits and vegetables for daily fiber intake.  May consider exercising on a regular basis. Encouraged patient to use his lunch time at work to incorporate a walk for about 10-20 minutes each day. Also suggested getting supportive shoes for walking to avoid injuries.   Sleep hygiene discussed at length. To establish a bedtime routine to assist with getting plenty of sleep each night, about 8-10 hours. This will allow Ian [name] to feel rested and not tired during the day.   NEXT APPOINTMENT: Return in about 3 months (around 04/07/2021) for f/u visit.  Ian Curie, NP Counseling Time: 30 mins Total Contact Time: 35 mns

## 2021-01-12 ENCOUNTER — Telehealth: Payer: Self-pay

## 2021-01-12 NOTE — Telephone Encounter (Signed)
Patients mother & brother are both patients of dr. Mardelle Matte and he would like to have her as PCP is it okay to schedule New patient apt with ANDY  Please advise

## 2021-01-12 NOTE — Telephone Encounter (Signed)
Patients mother is paula edwards and patients brother is brandon Dance movement psychotherapist

## 2021-01-12 NOTE — Telephone Encounter (Signed)
LVM to schedule apt with dr Mardelle Matte

## 2021-01-12 NOTE — Telephone Encounter (Signed)
OK to schedule

## 2021-01-13 NOTE — Telephone Encounter (Signed)
Patient scheduled.

## 2021-01-26 ENCOUNTER — Other Ambulatory Visit: Payer: Self-pay

## 2021-01-26 ENCOUNTER — Encounter: Payer: Self-pay | Admitting: Family Medicine

## 2021-01-26 ENCOUNTER — Ambulatory Visit: Payer: BC Managed Care – PPO | Admitting: Family Medicine

## 2021-01-26 VITALS — BP 130/90 | HR 100 | Temp 98.3°F | Ht 74.0 in | Wt 222.4 lb

## 2021-01-26 DIAGNOSIS — K649 Unspecified hemorrhoids: Secondary | ICD-10-CM | POA: Diagnosis not present

## 2021-01-26 NOTE — Patient Instructions (Signed)
Please follow up as scheduled for your next visit with me: 04/08/2021  Please come fasting to that appointment if possible. We will do blood work and a physical.   Take care and it was a pleasure meeting you!  If you have any questions or concerns, please don't hesitate to send me a message via MyChart or call the office at [phone]. Thank you for visiting with Ian [name] today! It's our pleasure caring for you.

## 2021-01-26 NOTE — Progress Notes (Signed)
Subjective  CC:  Chief Complaint  Patient presents with  . Hemorrhoids    Has been ongoing for years, 2 weeks ago felt a hard lump on the outside of anus    HPI: Ian [name] is a 25 y.o. male who presents to [facility] [place] today to establish care with me as a new patient.   He has the following concerns or needs:  25 year old male new patient with history of ADD and anxiety disorder followed by psychiatry.  Presents as new patient.  He is overall healthy.  He does have an external hemorrhoid that is not causing problems.  Noted about 2 weeks ago a tiny firm area outside of the rectum and he just wants to make sure it is okay.  No pain, rectal discharge, rectal bleeding, irritation or itching.  Assessment  1. Hemorrhoids, unspecified hemorrhoid type      Plan   Hemorrhoid with small subdermal mass: Reassured.  The area in question is 1 to 2 mm, nontender.  Does not seem to be pathologic.  Nothing further indicated.  Follow up: Has new patient appointment for physical in July. No orders of the defined types were placed in this encounter.  No orders of the defined types were placed in this encounter.    Depression screen PHQ 2/9 01/26/2021  Decreased Interest 0  Down, Depressed, Hopeless 0  PHQ - 2 Score 0    We updated and reviewed the patient's past history in detail and it is documented below.  Patient Active Problem List   Diagnosis Date Noted  . Generalized anxiety disorder 11/17/2015  . ADHD (attention deficit hyperactivity disorder), combined type 11/17/2015   Health Maintenance  Topic Date Due  . HIV Screening  Never done  . Hepatitis C Screening  Never done  . INFLUENZA VACCINE  04/13/2021  . TETANUS/TDAP  06/19/2025  . HPV VACCINES  Aged Out    There is no immunization history on file for this patient. No outpatient medications have been marked as taking for the 01/26/21 encounter (Office Visit) with Willow Ora, MD.     Allergies: Patient is allergic to morphine and related. Past Medical History Patient  has a past medical history of ADHD (attention deficit hyperactivity disorder) and Anxiety. Past Surgical History Patient  has a past surgical history that includes [place] teeth extraction. Family History: Patient family history includes ADD / ADHD in his brother, father, and mother; Sleep disorder in his father and mother. Social History:  Patient  reports that he has never smoked. He has never used smokeless tobacco.  Review of Systems: Constitutional: negative for fever or malaise Ophthalmic: negative for photophobia, double vision or loss of vision Cardiovascular: negative for chest pain, dyspnea on exertion, or new LE swelling Respiratory: negative for SOB or persistent cough Gastrointestinal: negative for abdominal pain, change in bowel habits or melena Genitourinary: negative for dysuria or gross hematuria Musculoskeletal: negative for new gait disturbance or muscular weakness Integumentary: negative for new or persistent rashes Neurological: negative for TIA or stroke symptoms Psychiatric: negative for SI or delusions Allergic/Immunologic: negative for hives  Patient Care Team    Relationship Specialty Notifications Start End  Tracey Harries, MD PCP - General Family Medicine  01/22/11     Objective  Vitals: BP 130/90   Pulse 100   Temp 98.3 F (36.8 C) (Temporal)   Ht 6\' 2"  (1.88 m)   Wt 222 lb 6.4 oz (100.9 kg)  SpO2 97%   BMI 28.55 kg/m  General:  Well developed, well nourished, no acute distress  Rectum: External nontender nonthrombosed hemorrhoid present, patient palpates a 1 to 2 mm tiny punctate subdermal mass without superficial redness, fluctuance or tenderness.  Commons side effects, risks, benefits, and alternatives for medications and treatment plan prescribed today were discussed, and the patient expressed understanding of the given instructions. Patient is  instructed to call or message via MyChart if he/she has any questions or concerns regarding our treatment plan. No barriers to understanding were identified. We discussed Red Flag symptoms and signs in detail. Patient expressed understanding regarding what to do in case of urgent or emergency type symptoms.   Medication list was reconciled, printed and provided to the patient in AVS. Patient instructions and summary information was reviewed with the patient as documented in the AVS. This note was prepared with assistance of Dragon voice recognition software. Occasional wrong-word or sound-a-like substitutions may have occurred due to the inherent limitations of voice recognition software  This visit occurred during the SARS-CoV-2 public health emergency.  Safety protocols were in place, including screening questions prior to the visit, additional usage of staff PPE, and extensive cleaning of exam room while observing appropriate contact time as indicated for disinfecting solutions.

## 2021-03-02 ENCOUNTER — Ambulatory Visit: Payer: BC Managed Care – PPO | Admitting: Family Medicine

## 2021-03-02 ENCOUNTER — Encounter: Payer: Self-pay | Admitting: Family Medicine

## 2021-03-02 ENCOUNTER — Other Ambulatory Visit: Payer: Self-pay

## 2021-03-02 VITALS — BP 135/84 | HR 89 | Temp 98.1°F | Ht 74.0 in | Wt 228.8 lb

## 2021-03-02 DIAGNOSIS — K644 Residual hemorrhoidal skin tags: Secondary | ICD-10-CM | POA: Diagnosis not present

## 2021-03-02 NOTE — Progress Notes (Signed)
   Subjective  CC:  Chief Complaint  Patient presents with   Hemorrhoids    No improvement since last visit    HPI: Ian [name] is a 25 y.o. male who presents to the office today to address the problems listed above in the chief complaint. 25 year old with history of anxiety presents for new rectal tender area.  I saw him not long ago for an external hemorrhoid.  He wants to make sure it is okay.  Normal bowel movements.  No blood passed rectally.  Otherwise feels well.  Because it is slightly tender he is concerned.  Has history of hemorrhoids.  Assessment  1. External hemorrhoid      Plan  Hemorrhoid: Small, nonthrombosed.  Reassured.  Supportive care.  Preparation H as needed.  When  Follow up:   04/08/2021  No orders of the defined types were placed in this encounter.  No orders of the defined types were placed in this encounter.     I reviewed the patients updated PMH, FH, and SocHx.    Patient Active Problem List   Diagnosis Date Noted   Generalized anxiety disorder 11/17/2015   ADHD (attention deficit hyperactivity disorder), combined type 11/17/2015   Current Meds  Medication Sig   sertraline (ZOLOFT) 100 MG tablet TAKE 1 TABLET BY MOUTH DAILY    Allergies: Patient is allergic to morphine and related. Family History: Patient family history includes ADD / ADHD in his brother, father, and mother; Sleep disorder in his father and mother. Social History:  Patient  reports that he has never smoked. He has never used smokeless tobacco.  Review of Systems: Constitutional: Negative for fever malaise or anorexia Cardiovascular: negative for chest pain Respiratory: negative for SOB or persistent cough Gastrointestinal: negative for abdominal pain  Objective  Vitals: BP 135/84   Pulse 89   Temp 98.1 F (36.7 C) (Temporal)   Ht 6\' 2"  (1.88 m)   Wt 228 lb 12.8 oz (103.8 kg)   SpO2 98%   BMI 29.38 kg/m  General: no acute distress , A&Ox3 Psych: Anxious  appearing Rectal: 2 small external hemorrhoids, one is minimally tender without erythema or fluctuance.  No other masses palpated.  Commons side effects, risks, benefits, and alternatives for medications and treatment plan prescribed today were discussed, and the patient expressed understanding of the given instructions. Patient is instructed to call or message via MyChart if he/she has any questions or concerns regarding our treatment plan. No barriers to understanding were identified. We discussed Red Flag symptoms and signs in detail. Patient expressed understanding regarding what to do in case of urgent or emergency type symptoms.  Medication list was reconciled, printed and provided to the patient in AVS. Patient instructions and summary information was reviewed with the patient as documented in the AVS. This note was prepared with assistance of Dragon voice recognition software. Occasional wrong-word or sound-a-like substitutions may have occurred due to the inherent limitations of voice recognition software  This visit occurred during the SARS-CoV-2 public health emergency.  Safety protocols were in place, including screening questions prior to the visit, additional usage of staff PPE, and extensive cleaning of exam room while observing appropriate contact time as indicated for disinfecting solutions.

## 2021-03-31 ENCOUNTER — Other Ambulatory Visit: Payer: Self-pay

## 2021-03-31 ENCOUNTER — Ambulatory Visit (INDEPENDENT_AMBULATORY_CARE_PROVIDER_SITE_OTHER): Payer: BC Managed Care – PPO | Admitting: Family

## 2021-03-31 ENCOUNTER — Encounter: Payer: Self-pay | Admitting: Family

## 2021-03-31 VITALS — BP 118/72 | HR 68 | Resp 16 | Ht 74.0 in | Wt 224.2 lb

## 2021-03-31 DIAGNOSIS — F902 Attention-deficit hyperactivity disorder, combined type: Secondary | ICD-10-CM

## 2021-03-31 DIAGNOSIS — Z79899 Other long term (current) drug therapy: Secondary | ICD-10-CM

## 2021-03-31 DIAGNOSIS — Z719 Counseling, unspecified: Secondary | ICD-10-CM | POA: Diagnosis not present

## 2021-03-31 DIAGNOSIS — F411 Generalized anxiety disorder: Secondary | ICD-10-CM

## 2021-03-31 DIAGNOSIS — Z7189 Other specified counseling: Secondary | ICD-10-CM

## 2021-03-31 MED ORDER — SERTRALINE HCL 100 MG PO TABS: ORAL_TABLET | ORAL | 2 refills | Status: DC

## 2021-03-31 NOTE — Progress Notes (Signed)
[facility] [address], [address] [place] [address] Dept: [phone] Dept Fax: [phone] Loc: [phone] Loc Fax: [phone]  Medication Check  Patient ID: Ian [name], male  DOB: Oct 27, 1995, 24 y.o.  MRN: [id number]  Date of Evaluation: 03/31/2021 PCP: Tracey Harries, MD  Accompanied by:  self Patient Lives with:  self and cats  HISTORY/CURRENT STATUS: HPI Patient here by himself for the visit today. Patient interactive and appropriate with provider today. Patient has continued with work full-time at [employer]. No difficulties with job performance and has continued with same schedule. Doing well with current level of anxiety and symptom control with Zoloft.   EDUCATION/WORK: Work: Scientist, product/process development.  Hours: daily with 8 hours and no issue, some pm Activities/ Exercise:  more active recently, brother bought a boat and has been swimming and participating in water activities, like swimming.   MEDICAL HISTORY: Appetite: Good  MVI/Other: None   Limiting the amount of foods daily, limiting soda, getting more variety of foods.   Sleep: Sleeping with no issues, about 6 hours on average each night during the work week Concerns: Initiation/Maintenance/Other: No concerns  Individual Medical History/ Review of Systems: Changes? :Dr. Mardelle Matte recently related to hemorrhoids with check recently.    Allergies: Morphine and related  Current Medications:  Current Outpatient Medications:    sertraline (ZOLOFT) 100 MG tablet, TAKE 1 TABLET BY MOUTH DAILY, Disp: 30 tablet, Rfl: 2 Medication Side Effects: None  Family Medical/ Social History: Changes? Yes Mom to have eye surgery on August 10th   MENTAL HEALTH: Mental Health Issues: Anxiety-well controlled with current dose of Zoloft.  PHYSICAL EXAM; Vitals:  Vitals:   03/31/21 0810  BP: 118/72  Pulse: 68   Resp: 16  Weight: 224 lb 3.2 oz (101.7 kg)  Height: 6\' 2"  (1.88 m)    General Physical Exam: Unchanged from previous exam, date:01/06/2021 Changed: None  DIAGNOSES:    ICD-10-CM   1. ADHD (attention deficit hyperactivity disorder), combined type  F90.2     2. Generalized anxiety disorder  F41.1 sertraline (ZOLOFT) 100 MG tablet    3. Patient counseled  Z71.9     4. Medication management  Z79.899     5. Goals of care, counseling/discussion  Z71.89      ASSESSMENT: Ian [name] is a 25 year old male with a history of ADHD and Anxiety with good symptom control with Zoloft 100 mg daily. No reported side effects or need for changes in dose today. 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 Sleeping scheduled has continued with no recent changes. Seen PCP on 2 occasions for concerns that resulted in no therapy or treatment. Eating better with more of a variety, less soda intake daily and less consumption at each meal. More physically active with water sports this summer and moving items in the warehouse. No needed changes with his medication or dose today.   RECOMMENDATIONS:  Patient provided updates with work, schedule, job performance, health and medical changes in the past 3 months.  Patient with no issues or concerns with current job duties and needing no assistance with performance of routine daily tasks.  Getting more activity this summer with participating in water sports and activities with family. Eating more variety and making better choices along with portion control to assist with weight management. Encouraged more water daily.   Discussed recent PCP  visit on 2 separate occasions for the same concern. No treatment needed and will continue to monitor. Suggested routine care and F/u as needed.  Sleep routine and hygiene reviewed today. Discussed limiting stimulus before bed and turning off screens at least 1 hour before  bedtime for more restful sleep.   Counseled medication pharmacokinetics, options, dosage, administration, desired effects, and possible side effects.   Zoloft 100 mg daily, # 30 with 2 RF's.RX for above e-scribed and sent to pharmacy on record  [id number] [place], [place] - [address] AT [facility] [address] [facility] [address] [place] [address] Phone: [phone] Fax: [phone]  I discussed the assessment and treatment plan with the patient. The patient was provided an opportunity to ask questions and all were answered. The patient agreed with the plan and demonstrated an understanding of the instructions.  NEXT APPOINTMENT: Return in about 3 months (around 07/01/2021) for f/u visit.  Carron Curie, NP Counseling Time: 27 mins Total Contact Time: 32 mins

## 2021-04-08 ENCOUNTER — Encounter: Payer: Self-pay | Admitting: Family Medicine

## 2021-04-08 ENCOUNTER — Other Ambulatory Visit: Payer: Self-pay

## 2021-04-08 ENCOUNTER — Ambulatory Visit: Payer: BC Managed Care – PPO | Admitting: Family Medicine

## 2021-04-08 VITALS — BP 131/80 | HR 69 | Temp 97.6°F | Resp 16 | Ht 74.0 in | Wt 225.6 lb

## 2021-04-08 DIAGNOSIS — F40298 Other specified phobia: Secondary | ICD-10-CM | POA: Diagnosis not present

## 2021-04-08 DIAGNOSIS — Z0000 Encounter for general adult medical examination without abnormal findings: Secondary | ICD-10-CM

## 2021-04-08 LAB — CBC WITH DIFFERENTIAL/PLATELET
Basophils Absolute: 0 10*3/uL (ref 0.0–0.1)
Basophils Relative: 0.8 % (ref 0.0–3.0)
Eosinophils Absolute: 0.1 10*3/uL (ref 0.0–0.7)
Eosinophils Relative: 1.4 % (ref 0.0–5.0)
HCT: 44.7 % (ref 39.0–52.0)
Hemoglobin: 15 g/dL (ref 13.0–17.0)
Lymphocytes Relative: 33.3 % (ref 12.0–46.0)
Lymphs Abs: 1.5 10*3/uL (ref 0.7–4.0)
MCHC: 33.6 g/dL (ref 30.0–36.0)
MCV: 84.6 fl (ref 78.0–100.0)
Monocytes Absolute: 0.4 10*3/uL (ref 0.1–1.0)
Monocytes Relative: 8.1 % (ref 3.0–12.0)
Neutro Abs: 2.6 10*3/uL (ref 1.4–7.7)
Neutrophils Relative %: 56.4 % (ref 43.0–77.0)
Platelets: 198 10*3/uL (ref 150.0–400.0)
RBC: 5.28 Mil/uL (ref 4.22–5.81)
RDW: 13.2 % (ref 11.5–15.5)
WBC: 4.6 10*3/uL (ref 4.0–10.5)

## 2021-04-08 LAB — COMPREHENSIVE METABOLIC PANEL
ALT: 18 U/L (ref 0–53)
AST: 19 U/L (ref 0–37)
Albumin: 4.6 g/dL (ref 3.5–5.2)
Alkaline Phosphatase: 52 U/L (ref 39–117)
BUN: 15 mg/dL (ref 6–23)
CO2: 29 mEq/L (ref 19–32)
Calcium: 9.4 mg/dL (ref 8.4–10.5)
Chloride: 98 mEq/L (ref 96–112)
Creatinine, Ser: 1.31 mg/dL (ref 0.40–1.50)
GFR: 74.64 mL/min (ref >60.00)
Glucose, Bld: 82 mg/dL (ref 70–99)
Potassium: 3.8 mEq/L (ref 3.5–5.1)
Sodium: 136 mEq/L (ref 135–145)
Total Bilirubin: 0.7 mg/dL (ref 0.2–1.2)
Total Protein: 7.5 g/dL (ref 6.0–8.3)

## 2021-04-08 LAB — TSH: TSH: 2.11 u[IU]/mL (ref 0.35–5.50)

## 2021-04-08 LAB — LIPID PANEL
Cholesterol: 152 mg/dL (ref 0–200)
HDL: 36.8 mg/dL — ABNORMAL LOW (ref >39.00)
LDL Cholesterol: 101 mg/dL — ABNORMAL HIGH (ref 0–99)
NonHDL: 115.44
Total CHOL/HDL Ratio: 4
Triglycerides: 74 mg/dL (ref 0.0–149.0)
VLDL: 14.8 mg/dL (ref 0.0–40.0)

## 2021-04-08 NOTE — Patient Instructions (Addendum)
Please return in 12 months for your annual complete physical; please come fasting.   I will release your lab results to you on your MyChart account with further instructions. Please reply with any questions.    If you have any questions or concerns, please don't hesitate to send me a message via MyChart or call the office at [phone]. Thank you for visiting with us today! It's our pleasure caring for you.   Please do these things to maintain good health!  Exercise at least 30-45 minutes a day,  4-5 days a week.  Eat a low-fat diet with lots of fruits and vegetables, up to 7-9 servings per day. Drink plenty of water daily. Try to drink 8 8oz glasses per day. Seatbelts can save your life. Always wear your seatbelt. Place Smoke Detectors on every level of your home and check batteries every year. Eye Doctor - have an eye exam every 1-2 years Safe sex - use condoms to protect yourself from STDs if you could be exposed to these types of infections. Avoid heavy alcohol use. If you drink, keep it to less than 2 drinks/day and not every day. Health Care Power of Attorney.  Choose someone you trust that could speak for you if you became unable to speak for yourself. Depression is common in our stressful world.If you're feeling down or losing interest in things you normally enjoy, please come in for a visit.  

## 2021-04-08 NOTE — Progress Notes (Signed)
Subjective  Chief Complaint  Patient presents with   New Patient (Initial Visit)    Requesting CPE today    HPI: Ian [name] is a 25 y.o. male who presents to [facility] today for a Male Wellness Visit.   Wellness Visit: annual visit with health maintenance review and exam   25 year old male with ADHD and generalized anxiety disorder managed by psychiatry.  Reviewed recent visit.  Both well controlled on current medications.  Here for complete physical.  No new concerns.  Physically feels well.  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  Does well there.  Lives alone with his cat.  He has good social support.  Body mass index is 28.97 kg/m. Wt Readings from Last 3 Encounters:  04/08/21 225 lb 9.6 oz (102.3 kg)  03/31/21 224 lb 3.2 oz (101.7 kg)  03/02/21 228 lb 12.8 oz (103.8 kg)    Patient Active Problem List   Diagnosis Date Noted   Needle phobia 04/08/2021   Generalized anxiety disorder 11/17/2015   ADHD (attention deficit hyperactivity disorder), combined type 11/17/2015   Health Maintenance  Topic Date Due   COVID-19 Vaccine (1) Never done   HIV Screening  Never done   Hepatitis C Screening  Never done   Pneumococcal Vaccine 61-48 Years old (1 - PCV) 04/08/2022 (Originally 11/02/1999)   INFLUENZA VACCINE  04/13/2021   TETANUS/TDAP  06/19/2025   HPV VACCINES  Aged Out   Immunization History  Administered Date(s) Administered   DTaP 01/04/1994, 03/08/1994, 05/27/1994, 10/12/1995   Hepatitis B, ped/adol 1994/08/03, 01/04/1994, 03/08/1994, 05/27/1994, 10/05/1994, 10/12/1995   HiB (PRP-OMP) 01/04/1994, 03/08/1994, 05/27/1994, 10/12/1995   MMR 10/12/1995, 06/20/2015   OPV 01/04/1994, 03/08/1994, 05/27/1994   Tdap 04/13/2005, 06/20/2015   We updated and reviewed the patient's past history in detail and it is documented below. Allergies: Patient is allergic to morphine and related. Past Medical History  has a past medical history of ADHD  (attention deficit hyperactivity disorder) and Anxiety. Past Surgical History  has a past surgical history that includes Widsom teeth extraction. Social History Patient  reports that he has never smoked. He has never used smokeless tobacco. He reports current alcohol use. He reports that he does not use drugs. Family History Patient family history includes ADD / ADHD in his brother, father, and mother; Sleep disorder in his father and mother. Review of Systems: Constitutional: negative for fever or malaise Ophthalmic: negative for photophobia, double vision or loss of vision Cardiovascular: negative for chest pain, dyspnea on exertion, or new LE swelling Respiratory: negative for SOB or persistent cough Gastrointestinal: negative for abdominal pain, change in bowel habits or melena Genitourinary: negative for dysuria or gross hematuria Musculoskeletal: negative for new gait disturbance or muscular weakness Integumentary: negative for new or persistent rashes, no breast lumps Neurological: negative for TIA or stroke symptoms Psychiatric: negative for SI or delusions Allergic/Immunologic: negative for hives  Patient Care Team    Relationship Specialty Notifications Start End  Leamon Arnt, MD PCP - General Family Medicine  04/08/21    Objective  Vitals: BP 131/80   Pulse 69   Temp 97.6 F (36.4 C) (Temporal)   Resp 16   Ht _0  (1.88 m)   Wt 225 lb 9.6 oz (102.3 kg)   SpO2 97%   BMI 28.97 kg/m  General:  Well developed, well nourished, no acute distress  Psych:  Alert and orientedx3,normal mood and affect HEENT:  Normocephalic, atraumatic, non-icteric sclera, PERRL,  oropharynx is clear without mass or exudate, supple neck without adenopathy, mass or thyromegaly Cardiovascular:  Normal S1, S2, RRR without gallop, rub or murmur, nondisplaced PMI, +2 distal pulses in bilateral upper and lower extremities. Respiratory:  Good breath sounds bilaterally, CTAB with normal respiratory  effort Gastrointestinal: normal bowel sounds, soft, non-tender, no noted masses. No HSM MSK: no deformities, contusions. Joints are without erythema or swelling. Spine and CVA region are nontender Skin:  Warm, no rashes or suspicious lesions noted Neurologic:    Mental status is normal. CN 2-11 are normal. Gross motor and sensory exams are normal. Stable gait. No tremor   Assessment  1. Annual physical exam   2. Needle phobia      Plan  Male Wellness Visit: Age appropriate Health Maintenance and Prevention measures were discussed with patient. Included topics are cancer screening recommendations, ways to keep healthy (see AVS) including dietary and exercise recommendations, regular eye and dental care, use of seat belts, and avoidance of moderate alcohol use and tobacco use.  BMI: discussed patient's BMI and encouraged positive lifestyle modifications to help get to or maintain a target BMI. HM needs and immunizations were addressed and ordered. See below for orders. See HM and immunization section for updates. Routine labs and screening tests ordered including cmp, cbc and lipids where appropriate. Discussed recommendations regarding Vit D and calcium supplementation (see AVS)  Follow up: Return in about 1 year (around 04/08/2022) for complete physical.  Commons side effects, risks, benefits, and alternatives for medications and treatment plan prescribed today were discussed, and the patient expressed understanding of the given instructions. Patient is instructed to call or message via MyChart if he/she has any questions or concerns regarding our treatment plan. No barriers to understanding were identified. We discussed Red Flag symptoms and signs in detail. Patient expressed understanding regarding what to do in case of urgent or emergency type symptoms.  Medication list was reconciled, printed and provided to the patient in AVS. Patient instructions and summary information was reviewed with  the patient as documented in the AVS. This note was prepared with assistance of Dragon voice recognition software. Occasional wrong-word or sound-a-like substitutions may have occurred due to the inherent limitations of voice recognition software This visit occurred during the SARS-CoV-2 public health emergency.  Safety protocols were in place, including screening questions prior to the visit, additional usage of staff PPE, and extensive cleaning of exam room while observing appropriate contact time as indicated for disinfecting solutions.   Orders Placed This Encounter  Procedures   CBC with Differential/Platelet   Comprehensive metabolic panel   Lipid panel   TSH   Hepatitis C antibody   HIV Antibody (routine testing w rflx)   No orders of the defined types were placed in this encounter.

## 2021-04-09 LAB — HEPATITIS C ANTIBODY
Hepatitis C Ab: NONREACTIVE
SIGNAL TO CUT-OFF: 0.01 (ref <1.00)

## 2021-04-09 LAB — HIV ANTIBODY (ROUTINE TESTING W REFLEX): HIV 1&2 Ab, 4th Generation: NONREACTIVE

## 2021-04-10 NOTE — Progress Notes (Signed)
Please call patient: I have reviewed his/her lab results. Everythign is normal.

## 2021-05-04 ENCOUNTER — Ambulatory Visit: Payer: BC Managed Care – PPO | Admitting: Physician Assistant

## 2021-05-04 ENCOUNTER — Other Ambulatory Visit: Payer: Self-pay

## 2021-05-04 ENCOUNTER — Encounter: Payer: Self-pay | Admitting: Physician Assistant

## 2021-05-04 VITALS — BP 119/78 | HR 71 | Temp 98.1°F | Ht 74.0 in | Wt 230.0 lb

## 2021-05-04 DIAGNOSIS — H60502 Unspecified acute noninfective otitis externa, left ear: Secondary | ICD-10-CM | POA: Diagnosis not present

## 2021-05-04 MED ORDER — OFLOXACIN 0.3 % OT SOLN: 5.0000 [drp] | Freq: Two times a day (BID) | OTIC | 0 refills | Status: AC

## 2021-05-04 MED ORDER — HYDROCORTISONE-ACETIC ACID 1-2 % OT SOLN: 4.0000 [drp] | Freq: Two times a day (BID) | OTIC | 0 refills | Status: AC

## 2021-05-04 NOTE — Progress Notes (Signed)
Acute Office Visit  Subjective:    Patient ID: Ian [name], male    DOB: 28-Jan-1996, 25 y.o.   MRN: [id number]  Chief Complaint  Patient presents with   Otalgia    Left ear    Otalgia   Patient is in today for left ear pain x 1 week. Started after [place]. Some drainage and hearing loss. No headaches or dizziness. He has been using alcohol drops with mild relief.   Past Medical History:  Diagnosis Date   ADHD (attention deficit hyperactivity disorder)    Anxiety     Past Surgical History:  Procedure Laterality Date   Widsom teeth extraction      Family History  Problem Relation Age of Onset   ADD / ADHD Mother    Sleep disorder Mother    ADD / ADHD Father    Sleep disorder Father    ADD / ADHD Brother     Social History   Socioeconomic History   Marital status: Single    Spouse name: Not on file   Number of children: Not on file   Years of education: Not on file   Highest education level: Not on file  Occupational History   Not on file  Tobacco Use   Smoking status: Never   Smokeless tobacco: Never  Vaping Use   Vaping Use: Never used  Substance and Sexual Activity   Alcohol use: Yes    Comment: socially   Drug use: Never   Sexual activity: Not Currently  Other Topics Concern   Not on file  Social History Narrative   Not on file   Social Determinants of Health   Financial Resource Strain: Not on file  Food Insecurity: Not on file  Transportation Needs: Not on file  Physical Activity: Not on file  Stress: Not on file  Social Connections: Not on file  Intimate Partner Violence: Not on file    Outpatient Medications Prior to Visit  Medication Sig Dispense Refill   sertraline (ZOLOFT) 100 MG tablet TAKE 1 TABLET BY MOUTH DAILY 30 tablet 2   No facility-administered medications prior to visit.    Allergies  Allergen Reactions   Morphine And Related Itching    Review of Systems  HENT:  Positive for ear pain.    REFER TO HPI FOR PERTINENT POSITIVES AND NEGATIVES     Objective:    Physical Exam HENT:     Right Ear: Hearing, tympanic membrane, ear canal and external ear normal.     Left Ear: Drainage, swelling and tenderness present.     Ears:     Comments: Left ear canal is erythematous and edematous, but still open. Drainage / cerumen is impeding view of TM.    BP 119/78   Pulse 71   Temp 98.1 F (36.7 C)   Ht 6\' 2"  (1.88 m)   Wt 230 lb (104.3 kg)   SpO2 98%   BMI 29.53 kg/m  Wt Readings from Last 3 Encounters:  05/04/21 230 lb (104.3 kg)  04/08/21 225 lb 9.6 oz (102.3 kg)  03/02/21 228 lb 12.8 oz (103.8 kg)    Health Maintenance Due  Topic Date Due   COVID-19 Vaccine (1) Never done   INFLUENZA VACCINE  04/13/2021    There are no preventive care reminders to display for this patient.   Lab Results  Component Value Date   TSH 2.11 04/08/2021   Lab Results  Component Value Date  WBC 4.6 04/08/2021   HGB 15.0 04/08/2021   HCT 44.7 04/08/2021   MCV 84.6 04/08/2021   PLT 198.0 04/08/2021   Lab Results  Component Value Date   NA 136 04/08/2021   K 3.8 04/08/2021   CO2 29 04/08/2021   GLUCOSE 82 04/08/2021   BUN 15 04/08/2021   CREATININE 1.31 04/08/2021   BILITOT 0.7 04/08/2021   ALKPHOS 52 04/08/2021   AST 19 04/08/2021   ALT 18 04/08/2021   PROT 7.5 04/08/2021   ALBUMIN 4.6 04/08/2021   CALCIUM 9.4 04/08/2021   GFR 74.64 04/08/2021   Lab Results  Component Value Date   CHOL 152 04/08/2021   Lab Results  Component Value Date   HDL 36.80 (L) 04/08/2021   Lab Results  Component Value Date   LDLCALC 101 (H) 04/08/2021   Lab Results  Component Value Date   TRIG 74.0 04/08/2021   Lab Results  Component Value Date   CHOLHDL 4 04/08/2021   No results found for: HGBA1C     Assessment & Plan:   Problem List Items Addressed This Visit   None Visit Diagnoses     Acute otitis externa of left ear, unspecified type    -  Primary        Meds  ordered this encounter  Medications   ofloxacin (FLOXIN OTIC) 0.3 % OTIC solution    Sig: Place 5 drops into the left ear 2 (two) times daily for 7 days. For ear infection    Dispense:  5 mL    Refill:  0   acetic acid-hydrocortisone (VOSOL-HC) OTIC solution    Sig: Place 4 drops into the left ear 2 (two) times daily for 7 days.    Dispense:  10 mL    Refill:  0   Plan: He has a swimmer's ear. TM not visible on exam. Use the drops as directed. Recheck in a few days and irrigate at that time when drops have had time to work.   Ruther Ephraim M Haylo Fake, PA-C

## 2021-05-04 NOTE — Patient Instructions (Addendum)
Use the Ofloxacin drops for infection Vosol solution drops to help with inflammation in the ear canal Tylenol or Ibuprofen as needed for pain  Recheck in a few days and we will flush the ear if needed

## 2021-05-07 ENCOUNTER — Ambulatory Visit: Payer: BC Managed Care – PPO | Admitting: Physician Assistant

## 2021-05-07 ENCOUNTER — Encounter: Payer: Self-pay | Admitting: Physician Assistant

## 2021-05-07 ENCOUNTER — Other Ambulatory Visit: Payer: Self-pay

## 2021-05-07 VITALS — BP 118/82 | HR 76 | Temp 98.1°F

## 2021-05-07 DIAGNOSIS — H60502 Unspecified acute noninfective otitis externa, left ear: Secondary | ICD-10-CM | POA: Diagnosis not present

## 2021-05-07 NOTE — Progress Notes (Signed)
Acute Office Visit  Subjective:    Patient ID: Ian [name], male    DOB: Jan 17, 1996, 25 y.o.   MRN: [id number]  Chief Complaint  Patient presents with   acute otitis externa of left ear    3 day recheck     HPI Patient is in today for recheck left ear OE. States it feels MUCH better and he is able to hear now since using the Ofloxacin drops. Did not pick up the Vosol-HC drops due to being out of stock, but feels like he doesn't need them.  Past Medical History:  Diagnosis Date   ADHD (attention deficit hyperactivity disorder)    Anxiety     Past Surgical History:  Procedure Laterality Date   Widsom teeth extraction      Family History  Problem Relation Age of Onset   ADD / ADHD Mother    Sleep disorder Mother    ADD / ADHD Father    Sleep disorder Father    ADD / ADHD Brother     Social History   Socioeconomic History   Marital status: Single    Spouse name: Not on file   Number of children: Not on file   Years of education: Not on file   Highest education level: Not on file  Occupational History   Not on file  Tobacco Use   Smoking status: Never   Smokeless tobacco: Never  Vaping Use   Vaping Use: Never used  Substance and Sexual Activity   Alcohol use: Yes    Comment: socially   Drug use: Never   Sexual activity: Not Currently  Other Topics Concern   Not on file  Social History Narrative   Not on file   Social Determinants of Health   Financial Resource Strain: Not on file  Food Insecurity: Not on file  Transportation Needs: Not on file  Physical Activity: Not on file  Stress: Not on file  Social Connections: Not on file  Intimate Partner Violence: Not on file    Outpatient Medications Prior to Visit  Medication Sig Dispense Refill   acetic acid-hydrocortisone (VOSOL-HC) OTIC solution Place 4 drops into the left ear 2 (two) times daily for 7 days. 10 mL 0   ofloxacin (FLOXIN OTIC) 0.3 % OTIC solution Place 5 drops into the left ear 2  (two) times daily for 7 days. For ear infection 5 mL 0   sertraline (ZOLOFT) 100 MG tablet TAKE 1 TABLET BY MOUTH DAILY 30 tablet 2   No facility-administered medications prior to visit.    Allergies  Allergen Reactions   Morphine And Related Itching    Review of Systems REFER TO HPI FOR PERTINENT POSITIVES AND NEGATIVES     Objective:    Physical Exam HENT:     Right Ear: Hearing, tympanic membrane, ear canal and external ear normal.     Left Ear: Tympanic membrane normal. No drainage, swelling or tenderness.     Ears:     Comments: Some soft cerumen / debris left ear canal, but TM looks normal and canal looks much better today. 05/07/21   BP 118/82   Pulse 76   Temp 98.1 F (36.7 C) (Temporal)   SpO2 97%  Wt Readings from Last 3 Encounters:  05/04/21 230 lb (104.3 kg)  04/08/21 225 lb 9.6 oz (102.3 kg)  03/02/21 228 lb 12.8 oz (103.8 kg)    Health Maintenance Due  Topic Date Due   COVID-19 Vaccine (1) Never  done   INFLUENZA VACCINE  04/13/2021    There are no preventive care reminders to display for this patient.   Lab Results  Component Value Date   TSH 2.11 04/08/2021   Lab Results  Component Value Date   WBC 4.6 04/08/2021   HGB 15.0 04/08/2021   HCT 44.7 04/08/2021   MCV 84.6 04/08/2021   PLT 198.0 04/08/2021   Lab Results  Component Value Date   NA 136 04/08/2021   K 3.8 04/08/2021   CO2 29 04/08/2021   GLUCOSE 82 04/08/2021   BUN 15 04/08/2021   CREATININE 1.31 04/08/2021   BILITOT 0.7 04/08/2021   ALKPHOS 52 04/08/2021   AST 19 04/08/2021   ALT 18 04/08/2021   PROT 7.5 04/08/2021   ALBUMIN 4.6 04/08/2021   CALCIUM 9.4 04/08/2021   GFR 74.64 04/08/2021   Lab Results  Component Value Date   CHOL 152 04/08/2021   Lab Results  Component Value Date   HDL 36.80 (L) 04/08/2021   Lab Results  Component Value Date   LDLCALC 101 (H) 04/08/2021   Lab Results  Component Value Date   TRIG 74.0 04/08/2021   Lab Results  Component  Value Date   CHOLHDL 4 04/08/2021   No results found for: HGBA1C     Assessment & Plan:   Problem List Items Addressed This Visit   None   1. Acute otitis externa of left ear, unspecified type -Great improvement -Finish course of Ofloxacin drops -Debrox OTC to remove rest of debris -Recheck prn     Jaylenn Altier M Zelma Mazariego, PA-C

## 2021-05-07 NOTE — Patient Instructions (Signed)
Ear looks much better today! Ear drum itself looks great. You do have some debris / wax in the canal, but you should be able to use Debrox kit OTC to remove the rest of this. Continue use of antibiotic drops through completion. Call if any concerns.

## 2021-06-16 ENCOUNTER — Other Ambulatory Visit: Payer: Self-pay

## 2021-06-16 ENCOUNTER — Ambulatory Visit: Payer: BC Managed Care – PPO | Admitting: Family

## 2021-06-16 VITALS — BP 112/64 | HR 76 | Ht 74.0 in | Wt 225.2 lb

## 2021-06-16 DIAGNOSIS — F411 Generalized anxiety disorder: Secondary | ICD-10-CM

## 2021-06-16 DIAGNOSIS — Z719 Counseling, unspecified: Secondary | ICD-10-CM

## 2021-06-16 DIAGNOSIS — Z79899 Other long term (current) drug therapy: Secondary | ICD-10-CM | POA: Diagnosis not present

## 2021-06-16 DIAGNOSIS — F902 Attention-deficit hyperactivity disorder, combined type: Secondary | ICD-10-CM

## 2021-06-16 DIAGNOSIS — Z7189 Other specified counseling: Secondary | ICD-10-CM

## 2021-06-16 MED ORDER — SERTRALINE HCL 100 MG PO TABS: ORAL_TABLET | ORAL | 2 refills | Status: DC

## 2021-06-16 NOTE — Progress Notes (Signed)
Medication Check  Patient ID: Ian [name]  DOB: [birth year]  MRN: [id number]  DATE:06/16/21 Ian Ora, MD  Accompanied by:  patient  Patient Lives with: patient  HISTORY/CURRENT STATUS: HPI Patient here by himself for the visit today Rescheduled visit for this afternoon since he didn't remember to put the visit in his phone for 0900 today. Patient appropriate and interactive. No current issues or concerns. 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 Some anxiety due to mother out of town and brother not at work today, but nothing that is excessive. Has continued with Zoloft 100 mg daily with no side effects.   EDUCATION/WORK: Work: Scientist, product/process development.  Hours: daily with 8 hours and no issue, some pm  Activities/ Exercise:  Walking for about an hour or more, looking at working out with weights.   Screen time: (phone, tablet, TV, computer): Computer for work   Driving: No ticket no accidents  MEDICAL HISTORY: Appetite: healthier choices   Sleep: 6 hours each night Concerns: Initiation/Maintenance/Other: non reported Elimination: None  Individual Medical History/ Review of Systems: Changes? :None  Family Medical/ Social History: Changes? No  Current Medications:  Zoloft 100 mg daily Medication Side Effects: None  MENTAL HEALTH: Anxiety with controlled symptoms with Zoloft 100 mg daily dose. No suicidal thoughts or ideations.   PHYSICAL EXAM; Vitals:   06/16/21 1339  BP: 112/64  Pulse: 76  Weight: 225 lb 3.2 oz (102.2 kg)  Height: 6\' 2"  (1.88 m)   Body mass index is 28.91 kg/m.  General Physical Exam: Unchanged from previous exam, date:03/31/2021   ASSESSMENT:  Ian [name] is a 25 year old with a diagnosis of ADHD and Anxiety that is well controlled on his current dose of 100 mg of Zoloft daily. Good efficacy for the entire day and no side effects reported. Ian [name] has been able to work with no current support needed. He is  able to perform daily tasks and job duties with no issues. Eating with healthier options and attempting to be weight conscious. Walking on a regular basis and starting a program with using weights. No changes in sleep pattern since last f/u visit. No recent health concerns in the past 3 months. No reported increase in anxiety outside of normal circumstances. Will continue with Zoloft 100 mg daily and reassess in the next few months.   DIAGNOSES:    ICD-10-CM   1. ADHD (attention deficit hyperactivity disorder), combined type  F90.2     2. Generalized anxiety disorder  F41.1 sertraline (ZOLOFT) 100 MG tablet    3. Medication management  Z79.899     4. Patient counseled  Z71.9     5. Goals of care, counseling/discussion  Z71.89      RECOMMENDATIONS:  Ian [name] provided updates with work, schedule, responsibilities, and projects.   No recent issues with job performance or completion of duties at work. No assistance with daily tasks and capable of full-time work hours.   Getting more responsibilities at work and participating more with projects to be completed at work.   Ian [name] is trying to watch his weight and eating better choices with food during the day. Exercising more often and incorporating more activity daily.   PCP in July for routine follow up visit and 2 other visits related to AOM. No follow up needed for recent concerns.  Discussed sleep routine and hygiene with view. Reviewed current schedule with continuation of 5-6 hours each night. No reported difficulties with  falling asleep, but needing to reduce stimulus.   Counseled medication pharmacokinetics, options, dosage, administration, desired effects, and possible side effects.   Zoloft 100 mg daily, # 30 with 2 RF's.RX for above e-scribed and sent to pharmacy on record   [facility] #[id number] [place], [place] - [address] AT [facility] OF [facility] [address] & [facility] [address] [place] [address] Phone:  [phone] Fax: [phone]   I discussed the assessment and treatment plan with the patient. The patient was provided an opportunity to ask questions and all were answered. The patient agreed with the plan and demonstrated an understanding of the instructions.   NEXT APPOINTMENT:  Return in about 3 months (around 09/16/2021) for f/u visit. Marland Kitchen

## 2021-06-18 ENCOUNTER — Encounter: Payer: Self-pay | Admitting: Family

## 2021-10-09 ENCOUNTER — Other Ambulatory Visit: Payer: Self-pay | Admitting: Family

## 2021-10-09 DIAGNOSIS — F411 Generalized anxiety disorder: Secondary | ICD-10-CM

## 2021-10-09 NOTE — Telephone Encounter (Signed)
Zoloft 100 mg daily, # 30 with 2 RF's.RX for above e-scribed and sent to pharmacy on record  [facility] #[id number] - [place], [place] - [address] AT [address] [place] [address] Phone: [phone] Fax: [phone]   

## 2021-10-14 ENCOUNTER — Other Ambulatory Visit: Payer: Self-pay

## 2021-10-14 ENCOUNTER — Telehealth (INDEPENDENT_AMBULATORY_CARE_PROVIDER_SITE_OTHER): Payer: BC Managed Care – PPO | Admitting: Family

## 2021-10-14 ENCOUNTER — Encounter: Payer: Self-pay | Admitting: Family

## 2021-10-14 DIAGNOSIS — R278 Other lack of coordination: Secondary | ICD-10-CM | POA: Diagnosis not present

## 2021-10-14 DIAGNOSIS — Z79899 Other long term (current) drug therapy: Secondary | ICD-10-CM

## 2021-10-14 DIAGNOSIS — F411 Generalized anxiety disorder: Secondary | ICD-10-CM

## 2021-10-14 DIAGNOSIS — F902 Attention-deficit hyperactivity disorder, combined type: Secondary | ICD-10-CM

## 2021-10-14 DIAGNOSIS — Z719 Counseling, unspecified: Secondary | ICD-10-CM

## 2021-10-14 DIAGNOSIS — Z7189 Other specified counseling: Secondary | ICD-10-CM

## 2021-10-14 NOTE — Progress Notes (Signed)
[facility] [place] [address] Dept: [phone] Dept Fax: [phone]  Medication Check visit via Virtual Video   Patient ID:  Ian [name]  male DOB: 1996-06-19   26 y.o.   MRN: [id number]   DATE:10/14/21  PCP: Leamon Arnt, MD  Virtual Visit via Video Note  I connected with  Ian [name] on 10/14/21 at  8:30 AM EST by a video enabled telemedicine application and verified that I am speaking with the correct person using two identifiers. Patient/Parent Location: in the car   I discussed the limitations, risks, security and privacy concerns of performing an evaluation and management service by telephone and the availability of in person appointments. I also discussed with the parents that there may be a patient responsible charge related to this service. The parents expressed understanding and agreed to proceed.  Provider: Carolann Littler, NP  Location: work location  HPI/CURRENT STATUS: Ian [name] is here for medication management of the psychoactive medications for ADHD and review of educational and behavioral concerns.   Ian [name] currently taking Zoloft 100 mg daily, which is working well. Takes medication daily in the morning. Medication tends to last for the time needed. Ian [name] is able to focus through work.   Ian [name] is eating well (eating breakfast, lunch and dinner). Ian [name] does not have appetite suppression and staying consistent with less fats and cholesterol.   Sleeping well (getting 5-8 hour of sleep), sleeping through the night. Bensyn does some nights with delayed sleep onset.  EDUCATION/WORK: Work: Conservation officer, nature.  Hours: daily with 8 hours and no issue, some pm  Activities/ Exercise: intermittently-walking on occasion outside Will start working out with his mother  MEDICAL HISTORY: Individual Medical History/ Review of Systems: Had Covid-19 back in  November  Has been healthy with no visits to the PCP. [place] due yearly in July.   Family Medical/ Social History: Changes? None  Patient Lives with:  self  MENTAL HEALTH: Mental Health Issues:   Anxiety-Zoloft 100 mg daily, good symptom control.    Allergies: Allergies  Allergen Reactions   Morphine And Related Itching   Current Medications:  Current Outpatient Medications on File Prior to Visit  Medication Sig Dispense Refill   sertraline (ZOLOFT) 100 MG tablet TAKE 1 TABLET BY MOUTH DAILY 30 tablet 2   No current facility-administered medications on file prior to visit.   Medication Side Effects: None  DIAGNOSES:    ICD-10-CM   1. ADHD (attention deficit hyperactivity disorder), combined type  F90.2     2. Generalized anxiety disorder  F41.1     3. Dysgraphia  R27.8     4. Medication management  Z79.899     5. Patient counseled  Z71.9     6. Goals of care, counseling/discussion  Z71.89      ASSESSMENT:      Toluwanimi is a 26 year old male with a history of ADHD and Anxiety that has been well controlled on Zoloft 100 mg daily. Reported good efficacy and no side effects with current medications. Azlan has continued to work full time at [employer] with his mother and brother. No current support needed for work duties and responsibilities. Eating healthier options to look at being health conscious. [place] on occasion and to start with working out with mother. Sleeping on average of 5-8 hours each night due to some initiation. No current medication concerns or need for dose changes today.  PLAN/RECOMMENDATIONS:  Updates with work, schedule, projects and new options for upcoming   Working full time with no changes in his schedule and has continued with projects. No support needed.   More projects and responsibilities at work due to his work duties.   Lochlin is watching what he eating and trying to ea healthier options to monitor his weight.   Some walking when the weather is good  and will start working out with his mother on a regular basis.  PCP for routine visit in the summer time.  No other recent visits or health care issues.   Sleep routine and sleep hygiene discussed due to some initiation difficulties. Suggested melatonin at HS 1/2 tablet after 1 hour of staying awake.   Counseled medication pharmacokinetics, options, dosage, administration, desired effects, and possible side effects.   Zoloft 100 mg daily, no Rx today  I discussed the assessment and treatment plan with the patient & parent. The patient & parent was provided an opportunity to ask questions and all were answered. The patient & parent agreed with the plan and demonstrated an understanding of the instructions.   NEXT APPOINTMENT:  Visit date not found-f/u visit in 3 months Telehealth OK  The patient & parent was advised to call back or seek an in-person evaluation if the symptoms worsen or if the condition fails to improve as anticipated.  Carolann Littler, NP

## 2022-01-19 ENCOUNTER — Ambulatory Visit: Payer: BC Managed Care – PPO | Admitting: Family

## 2022-01-19 ENCOUNTER — Encounter: Payer: Self-pay | Admitting: Family

## 2022-01-19 VITALS — BP 112/76 | HR 76 | Resp 16 | Ht 74.0 in | Wt 228.6 lb

## 2022-01-19 DIAGNOSIS — Z719 Counseling, unspecified: Secondary | ICD-10-CM | POA: Diagnosis not present

## 2022-01-19 DIAGNOSIS — F411 Generalized anxiety disorder: Secondary | ICD-10-CM | POA: Diagnosis not present

## 2022-01-19 DIAGNOSIS — Z79899 Other long term (current) drug therapy: Secondary | ICD-10-CM

## 2022-01-19 DIAGNOSIS — F902 Attention-deficit hyperactivity disorder, combined type: Secondary | ICD-10-CM

## 2022-01-19 DIAGNOSIS — Z7189 Other specified counseling: Secondary | ICD-10-CM

## 2022-01-19 MED ORDER — SERTRALINE HCL 100 MG PO TABS: 100.0000 mg | ORAL_TABLET | Freq: Every day | ORAL | 2 refills | Status: DC

## 2022-01-19 NOTE — Progress Notes (Signed)
?[place] [facility] ?[place] [address] ?Dept: [phone] ?Dept Fax: [phone] ?Loc: [phone] ?Loc Fax: [phone] ? ?Medication Check ? ?Patient ID: Ian [name], male  DOB: 1996/02/15, 26 y.o.  MRN: [id number] ? ?Date of Evaluation: 01/19/2022 ?PCP: Leamon Arnt, MD ? ?Accompanied by:  self ?Patient Lives with:    ? ?HISTORY/CURRENT STATUS: ?HPI Patient here by himself for the visit. Patient interactive and appropriate with provider.  ? 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 ?Activities/ Exercise: intermittently-not much, but occasionally will get lifting in less than 1 time/weekly.  ? ?MEDICAL HISTORY: ?Appetite: Good with better food choices ?Reduction in portion control of other foods ?More greens incorporated with other foods ?MVI/Other: None reported ? ?Sleep: Normal schedule with no issues reported ?Concerns: Initiation/Maintenance/Other: None, have been "hitting the wall" between 1-3:00 pm ? ?Individual Medical History/ Review of Systems: Changes? :None reported recently.  ? ?Allergies: Morphine and related ? ?Current Medications:  ?Current Outpatient Medications  ?Medication Instructions  ? sertraline (ZOLOFT) 100 mg, Oral, Daily  ? ?Medication Side Effects: None ? ?Family Medical/ Social History: Changes? None reported ? ?MENTAL HEALTH: ?Mental Health Issues: Anxiety-well controlled with current Zoloft dose.  ? ?PHYSICAL EXAM; ?Vitals:  ? ?General Physical Exam: ?Unchanged from previous exam, date:10/14/2021 Changed:None ? ?DIAGNOSES:  ?  ICD-10-CM   ?1. ADHD (attention deficit hyperactivity disorder), combined type  F90.2   ?  ?2. Generalized anxiety disorder  F41.1 sertraline (ZOLOFT) 100 MG tablet  ?  ?3. Medication management  Z79.899   ?  ?4. Patient counseled  Z71.9   ?  ?5. Goals of care,  counseling/discussion  Z71.89   ?  ? ?ASSESSMENT: ?Ian [name] is a 26 year old male with a history of ADHD, Dysgraphia and Anxiety. He has been well controlled on Zoloft 100 mg daily with good efficacy and no side effects. Cecilio has continued working full time at [employer] with his family and working on projects. No issues reported with work duties or responsibilities. Eating healthier food options more often and using portion control. Some exercise but not routinely working out. No sleep issues reported and getting a good amount of sleep each night. Will continue with Zoloft 100 mg daily with no changes needed.  ? ?RECOMMENDATIONS:  ?Work updates and current work schedule discussed with continued full time schedule.  ? ?Projects with upcoming options for new endeavors with work.  ? ?Eating  habits discussed with changes for healthier food varieties and portion control. ? ?Encouraged more exercise and suggested to walk more during lunch or after work in the evening time.  ? ?F/u for routine health care visits yearly in the summer time. Discussed continued care and f/u with dentist as recommended. ? ?Sleep hygiene and better sleep routine discussed with patient related to his work schedule. No changes and taking melatonin at HS as needed.  ? ?Decreased energy at 1-3:00 pm daily at work with a slump. Discussed options for increasing energy in the afternoon.  ? ?Counseled medication pharmacokinetics, options, dosage, administration, desired effects, and possible side effects.   ?Zoloft 100 mg daily, # 30 with 2 RF's ?RX for above e-scribed and sent to pharmacy on record ? ?[facility] [place], [address] ?[place] [address] ?Phone: [phone] Fax: [phone] ? ?I discussed the assessment and  treatment plan with the patient & parent. The patient & parent was provided an opportunity to ask questions and all were answered. The  patient & parent agreed with the plan and demonstrated an understanding of the instructions. ? ?NEXT APPOINTMENT: Return in about 3 months (around 04/21/2022) for f/u visit . ? ?The patient & parent was advised to call back or seek an in-person evaluation if the symptoms worsen or if the condition fails to improve as anticipated. ? ?Carolann Littler, NP  ? ?

## 2022-05-03 ENCOUNTER — Other Ambulatory Visit: Payer: Self-pay

## 2022-05-03 DIAGNOSIS — F411 Generalized anxiety disorder: Secondary | ICD-10-CM

## 2022-05-03 MED ORDER — SERTRALINE HCL 100 MG PO TABS: 100.0000 mg | ORAL_TABLET | Freq: Every day | ORAL | 2 refills | Status: DC

## 2022-05-03 NOTE — Telephone Encounter (Signed)
Zoloft 100 mg daily, # 30 with 2 RF's.RX for above e-scribed and sent to pharmacy on record  [facility] #[id number] [place], [place] - [address] AT [place] OF [facility] [address] & [facility] [address] [place] [address] Phone: [phone] Fax: [phone]

## 2022-05-19 ENCOUNTER — Encounter: Payer: Self-pay | Admitting: Family

## 2022-05-19 ENCOUNTER — Ambulatory Visit: Payer: BC Managed Care – PPO | Admitting: Family

## 2022-05-19 VITALS — BP 112/68 | HR 68 | Resp 16 | Ht 73.0 in | Wt 226.4 lb

## 2022-05-19 DIAGNOSIS — Z79899 Other long term (current) drug therapy: Secondary | ICD-10-CM | POA: Diagnosis not present

## 2022-05-19 DIAGNOSIS — F411 Generalized anxiety disorder: Secondary | ICD-10-CM | POA: Diagnosis not present

## 2022-05-19 DIAGNOSIS — Z719 Counseling, unspecified: Secondary | ICD-10-CM

## 2022-05-19 DIAGNOSIS — Z7189 Other specified counseling: Secondary | ICD-10-CM

## 2022-05-19 DIAGNOSIS — F902 Attention-deficit hyperactivity disorder, combined type: Secondary | ICD-10-CM | POA: Diagnosis not present

## 2022-05-19 NOTE — Progress Notes (Signed)
[facility] [address] [place] [address] Dept: [phone] Dept Fax: [phone] Loc: [phone] Loc Fax: [phone]  Medication Check  Patient ID: Ian [name], male  DOB: 1996/08/28, 26 y.o.  MRN: [id number]  Date of Evaluation: 05/19/2022 PCP: Willow Ora, MD  Accompanied by:  self Patient Lives with:  self  HISTORY/CURRENT STATUS: HPI Patient here by himself for the visit today and interactive with provider. Patient has continued to work full time with no changes reported. No changes reported with health or medical in the past few months. Has continued with Zoloft 100 mg with no side effects reported. Last f/u visit on 01/19/2022.  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 :No routine health care and had dentist visit with no concerns.   Allergies: Morphine and related  Current Medications:  Current Outpatient Medications  Medication Instructions   sertraline (ZOLOFT) 100 mg, Oral, Daily   Medication Side Effects: None Family Medical/ Social History: Changes? None   MENTAL HEALTH: Mental Health Issues: Anxiety  PHYSICAL EXAM; Vitals:  Vitals:   05/19/22 1014  BP: 112/68  Pulse: 68  Resp: 16  Weight: 226 lb 6.4 oz (102.7 kg)  Height: 6\' 1"  (1.854 m)    General Physical Exam: Unchanged from previous exam, date:01/19/2022 Changed:none  DIAGNOSES:     ICD-10-CM   1. ADHD (attention deficit hyperactivity disorder), combined type  F90.2     2. Generalized anxiety disorder  F41.1     3. Medication management  Z79.899     4. Patient counseled  Z71.9     5. Goals of care, counseling/discussion  Z71.89      ASSESSMENT: Ian [name] is a 26 year old male with a history of ADHD and Anxiety. Haru has been maintained on Zoloft 100 mg daily with good symptom control. He has continued to work full time with his family at [employer]. He is assisting with various projects and updating the [employer]. No current issues reported with work related duties or responsibilities. No changes with eating, sleeping or health. Has continued with eating less to control weight with minimal activity, but swimming. Sleeping well most nights with some occasional waking but nothing significant. No health changes reported over the past 3 months. Will continue with Zoloft 100 mg daily with no changes.   RECOMMENDATIONS:  Updates with work and recent projects reviewed with patient today.  No concerns with work or work [employer] with daily requirements of his job.  Discussed health and required health maintenance for routine care.  Eating habits and changes recently to his diet for healthy weight management.  Discussed swimming and activity for health promotion along with social interactions.   Sleep schedule discussed with occasional waking but most days has a routine for sleep schedule.   Medication management of his current symptoms with medication reviewed.  Anxiety with no current concerns or need for therapy at this time.   Counseled medication pharmacokinetics, options, dosage, administration, desired effects, and possible side effects.   Zoloft 100 mg  daily, no Rx today  I discussed the assessment and treatment plan with the patient & parent. The patient & parent was provided an opportunity to ask questions and all were answered. The patient &  parent agreed with the plan and demonstrated an understanding of the instructions.  NEXT APPOINTMENT: Return in about 3 months (around 08/18/2022) for f/u visit .  The patient & parent was advised to call back or seek an in-person evaluation if the symptoms worsen or if the condition fails to improve as anticipated.  Carron Curie, NP

## 2022-06-07 ENCOUNTER — Encounter: Payer: Self-pay | Admitting: *Deleted

## 2022-08-12 ENCOUNTER — Other Ambulatory Visit: Payer: Self-pay | Admitting: Family

## 2022-08-12 DIAGNOSIS — F411 Generalized anxiety disorder: Secondary | ICD-10-CM

## 2022-08-26 ENCOUNTER — Encounter: Payer: Self-pay | Admitting: *Deleted

## 2022-10-11 ENCOUNTER — Telehealth (INDEPENDENT_AMBULATORY_CARE_PROVIDER_SITE_OTHER): Payer: BC Managed Care – PPO | Admitting: Family

## 2022-10-11 ENCOUNTER — Encounter: Payer: Self-pay | Admitting: Family

## 2022-10-11 DIAGNOSIS — R278 Other lack of coordination: Secondary | ICD-10-CM | POA: Diagnosis not present

## 2022-10-11 DIAGNOSIS — Z79899 Other long term (current) drug therapy: Secondary | ICD-10-CM

## 2022-10-11 DIAGNOSIS — F411 Generalized anxiety disorder: Secondary | ICD-10-CM

## 2022-10-11 DIAGNOSIS — F902 Attention-deficit hyperactivity disorder, combined type: Secondary | ICD-10-CM

## 2022-10-11 DIAGNOSIS — Z7189 Other specified counseling: Secondary | ICD-10-CM

## 2022-10-11 DIAGNOSIS — Z719 Counseling, unspecified: Secondary | ICD-10-CM

## 2022-10-11 MED ORDER — SERTRALINE HCL 100 MG PO TABS: 100.0000 mg | ORAL_TABLET | Freq: Every day | ORAL | 2 refills | Status: AC

## 2022-10-11 NOTE — Progress Notes (Signed)
[facility]. [address] [place] [address] Dept: [phone] Dept Fax: [phone]  Medication Check visit via Virtual Video   Patient ID:  Ian [name]  male DOB: 1995/12/10   27 y.o.   MRN: [id number]   DATE:10/11/22  PCP: Leamon Arnt, MD  Virtual Visit via Video Note I connected with  Brett on 10/11/22 at  8:00 AM EST by a video enabled telemedicine application and verified that I am speaking with the correct person using two identifiers. Patient/Parent Location: at home  I discussed the limitations, risks, security and privacy concerns of performing an evaluation and management service by telephone and the availability of in person appointments. I also discussed with the parents that there may be a patient responsible charge related to this service. The parents expressed understanding and agreed to proceed.  Provider: Carolann Littler, NP  Location: private work location  HPI/CURRENT STATUS: Nichlos is here for medication management of the psychoactive medications for ADHD and review of educational and behavioral concerns.   Kross currently taking Zoloft 100 mg daily, which is working well. Takes medication as directed daily. No current side effects and controlling his symptoms.   Advaith is eating well (eating breakfast, lunch and dinner). Brigham does not have appetite suppression. Has made some changes with his health for better food choices and limiting sodas to 1 daily.   Sleeping well (getting enough sleep), sleeping through the night. Kalyn does not have delayed sleep onset and on a rare occasion will have issues with sleeping due to anxious thoughts.   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   MEDICAL HISTORY: Individual Medical History/ Review of Systems:  None reported  Has been healthy with no visits to the PCP. [place] due yearly.   Family Medical/ Social History:  Brailen Lives with:  self and 2 cats  MENTAL HEALTH: Mental Health Issues:   Anxiety Good symptom control.   Allergies: Allergies  Allergen Reactions   Morphine And Related Itching   Current Medications:  No current outpatient medications on file prior to visit.   No current facility-administered medications on file prior to visit.   Medication Side Effects: None  DIAGNOSES:    ICD-10-CM   1. ADHD (attention deficit hyperactivity disorder), combined type  F90.2     2. Generalized anxiety disorder  F41.1 sertraline (ZOLOFT) 100 MG tablet    3. Dysgraphia  R27.8     4. Medication management  Z79.899     5. Patient counseled  Z71.9     6. Goals of care, counseling/discussion  Z71.89     ASSESSMENT:      Aaron Edelman is a 27 year old male with a history of ADHD and Anxiety. He has been well maintained on Zoloft 100 mg daily with no side effects. 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 Working on various projects with no reported issues with performance. Recently started to be more aware of his dietary intake and making better food choices. Some activity and social interactions on a regular basis. He has an area on his side he is concerned about but has not consulted a physician. NO other medical changes reported. Sleeping well most nights, but on a rare occasion, may once a month, has a night where he is not sleeping well. Will continue with current medication and dosing.  PLAN/RECOMMENDATIONS:  Updates with work and current work projects with his brother.  No current difficulties with work performance and no significant changes.  Health f/u suggested regarding new area on his side with no pain or discomfort.   Supported continued healthy food choices and encouraged more exercise.  Sleep habits discussed with use of melatonin on night with more  issues with initiation.   Medication management for continued success with his current dose of Zoloft 100 mg daily.   Counseled medication pharmacokinetics, options, dosage, administration, desired effects, and possible side effects.   Zoloft 100 mg daily, #30 with 2 RF's.RX for above e-scribed and sent to pharmacy on record  [facility] [place], [address] AT [place] [address] Phone: [phone] Fax: [phone]  I discussed the assessment and treatment plan with [facility] Zavien was provided an opportunity to ask questions and all were answered. Hillary agreed with the plan and demonstrated an understanding of the instructions.  REVIEW OF CHART, FACE TO FACE CLINIC TIME AND DOCUMENTATION TIME DURING TODAY'S VISIT:  3 months      NEXT APPOINTMENT: 6 month Telehealth OK  The patient was advised to call back or seek an in-person evaluation if the symptoms worsen or if the condition fails to improve as anticipated.   Carolann Littler, NP

## 2023-01-19 ENCOUNTER — Ambulatory Visit (INDEPENDENT_AMBULATORY_CARE_PROVIDER_SITE_OTHER): Payer: BC Managed Care – PPO | Admitting: Family Medicine

## 2023-01-19 ENCOUNTER — Encounter: Payer: Self-pay | Admitting: Family Medicine

## 2023-01-19 VITALS — BP 114/72 | HR 85 | Temp 98.4°F | Ht 73.0 in | Wt 224.4 lb

## 2023-01-19 DIAGNOSIS — F411 Generalized anxiety disorder: Secondary | ICD-10-CM | POA: Diagnosis not present

## 2023-01-19 DIAGNOSIS — Z0000 Encounter for general adult medical examination without abnormal findings: Secondary | ICD-10-CM

## 2023-01-19 DIAGNOSIS — F902 Attention-deficit hyperactivity disorder, combined type: Secondary | ICD-10-CM

## 2023-01-19 LAB — COMPREHENSIVE METABOLIC PANEL
ALT: 12 U/L (ref 0–53)
AST: 14 U/L (ref 0–37)
Albumin: 4.4 g/dL (ref 3.5–5.2)
Alkaline Phosphatase: 50 U/L (ref 39–117)
BUN: 15 mg/dL (ref 6–23)
CO2: 30 mEq/L (ref 19–32)
Calcium: 9.5 mg/dL (ref 8.4–10.5)
Chloride: 102 mEq/L (ref 96–112)
Creatinine, Ser: 1.28 mg/dL (ref 0.40–1.50)
GFR: 75.79 mL/min (ref >60.00)
Glucose, Bld: 89 mg/dL (ref 70–99)
Potassium: 3.8 mEq/L (ref 3.5–5.1)
Sodium: 140 mEq/L (ref 135–145)
Total Bilirubin: 0.5 mg/dL (ref 0.2–1.2)
Total Protein: 7.2 g/dL (ref 6.0–8.3)

## 2023-01-19 LAB — LIPID PANEL
Cholesterol: 155 mg/dL (ref 0–200)
HDL: 37.7 mg/dL — ABNORMAL LOW (ref >39.00)
LDL Cholesterol: 105 mg/dL — ABNORMAL HIGH (ref 0–99)
NonHDL: 117.63
Total CHOL/HDL Ratio: 4
Triglycerides: 64 mg/dL (ref 0.0–149.0)
VLDL: 12.8 mg/dL (ref 0.0–40.0)

## 2023-01-19 LAB — CBC WITH DIFFERENTIAL/PLATELET
Basophils Absolute: 0 10*3/uL (ref 0.0–0.1)
Basophils Relative: 0.6 % (ref 0.0–3.0)
Eosinophils Absolute: 0.1 10*3/uL (ref 0.0–0.7)
Eosinophils Relative: 1.2 % (ref 0.0–5.0)
HCT: 44.5 % (ref 39.0–52.0)
Hemoglobin: 15.4 g/dL (ref 13.0–17.0)
Lymphocytes Relative: 25.5 % (ref 12.0–46.0)
Lymphs Abs: 1.4 10*3/uL (ref 0.7–4.0)
MCHC: 34.6 g/dL (ref 30.0–36.0)
MCV: 83.2 fl (ref 78.0–100.0)
Monocytes Absolute: 0.4 10*3/uL (ref 0.1–1.0)
Monocytes Relative: 7.9 % (ref 3.0–12.0)
Neutro Abs: 3.5 10*3/uL (ref 1.4–7.7)
Neutrophils Relative %: 64.8 % (ref 43.0–77.0)
Platelets: 222 10*3/uL (ref 150.0–400.0)
RBC: 5.35 Mil/uL (ref 4.22–5.81)
RDW: 13.2 % (ref 11.5–15.5)
WBC: 5.5 10*3/uL (ref 4.0–10.5)

## 2023-01-19 NOTE — Patient Instructions (Signed)
Please return in 12 months for your annual complete physical; please come fasting.  ? ?I will release your lab results to you on your MyChart account with further instructions. You may see the results before I do, but when I review them I will send you a message with my report or have my assistant call you if things need to be discussed. Please reply to my message with any questions. Thank you!  ? ?If you have any questions or concerns, please don't hesitate to send me a message via MyChart or call the office at [phone]. Thank you for visiting with us today! It's our pleasure caring for you.  ? ?Please do these things to maintain good health! ? ?Exercise at least 30-45 minutes a day,  4-5 days a week.  ?Eat a low-fat diet with lots of fruits and vegetables, up to 7-9 servings per day. ?Drink plenty of water daily. Try to drink 8 8oz glasses per day. ?Seatbelts can save your life. Always wear your seatbelt. ?Place Smoke Detectors on every level of your home and check batteries every year. ?Eye Doctor - have an eye exam every 1-2 years ?Safe sex - use condoms to protect yourself from STDs if you could be exposed to these types of infections. ?Avoid heavy alcohol use. If you drink, keep it to less than 2 drinks/day and not every day. ?Health Care Power of Attorney.  Choose someone you trust that could speak for you if you became unable to speak for yourself. ?Depression is common in our stressful world.If you're feeling down or losing interest in things you normally enjoy, please come in for a visit.  ?

## 2023-01-19 NOTE — Progress Notes (Signed)
Subjective  Chief Complaint  Patient presents with   Annual Exam    Pt here for Annual Exam and is currently fasting    HPI: Ian [name] is a 27 y.o. male who presents to [facility] [place] today for a Male Wellness Visit.   Wellness Visit: annual visit with health maintenance review and exam   Health maintenance: Overall doing well.  Immunizations up-to-date.  No physical concerns.  His mother has had some health condition concerns, today got results that show she likely does not have pancreatic cancer, he is appropriately relieved.  Sees psychology regularly.  Reviewed recent note.  Stable on sertraline.  Body mass index is 29.61 kg/m. Wt Readings from Last 3 Encounters:  01/19/23 224 lb 6.4 oz (101.8 kg)  05/19/22 226 lb 6.4 oz (102.7 kg)  01/19/22 228 lb 9.6 oz (103.7 kg)   Assessment  1. Annual physical exam   2. Generalized anxiety disorder   3. ADHD (attention deficit hyperactivity disorder), combined type      Plan  Male Wellness Visit: Age appropriate Health Maintenance and Prevention measures were discussed with patient. Included topics are cancer screening recommendations, ways to keep healthy (see AVS) including dietary and exercise recommendations, regular eye and dental care, use of seat belts, and avoidance of moderate alcohol use and tobacco use.  BMI: discussed patient's BMI and encouraged positive lifestyle modifications to help get to or maintain a target BMI. HM needs and immunizations were addressed and ordered. See below for orders. See HM and immunization section for updates. Routine labs and screening tests ordered including cmp, cbc and lipids where appropriate. Discussed recommendations regarding Vit D and calcium supplementation (see AVS)  Follow up: 12 months for complete physical Commons side effects, risks, benefits, and alternatives for medications and treatment plan prescribed today were discussed, and the patient expressed  understanding of the given instructions. Patient is instructed to call or message via MyChart if he/she has any questions or concerns regarding our treatment plan. No barriers to understanding were identified. We discussed Red Flag symptoms and signs in detail. Patient expressed understanding regarding what to do in case of urgent or emergency type symptoms.  Medication list was reconciled, printed and provided to the patient in AVS. Patient instructions and summary information was reviewed with the patient as documented in the AVS. This note was prepared with assistance of Dragon voice recognition software. Occasional wrong-word or sound-a-like substitutions may have occurred due to the inherent limitations of voice recognition software   Orders Placed This Encounter  Procedures   Lipid panel   Comprehensive metabolic panel   CBC with Differential/Platelet   No orders of the defined types were placed in this encounter.      Patient Active Problem List   Diagnosis Date Noted   Needle phobia 04/08/2021   Generalized anxiety disorder 11/17/2015   ADHD (attention deficit hyperactivity disorder), combined type 11/17/2015   Health Maintenance  Topic Date Due   COVID-19 Vaccine (1) 02/04/2023 (Originally 11/01/1998)   INFLUENZA VACCINE  04/14/2023   DTaP/Tdap/Td (7 - Td or Tdap) 06/19/2025   Hepatitis C Screening  Completed   HIV Screening  Completed   HPV VACCINES  Aged Out   Immunization History  Administered Date(s) Administered   DTaP 01/04/1994, 03/08/1994, 05/27/1994, 10/12/1995   HIB (PRP-OMP) 01/04/1994, 03/08/1994, 05/27/1994, 10/12/1995   Hepatitis B, PED/ADOLESCENT 1993/09/14, 01/04/1994, 03/08/1994, 05/27/1994, 10/05/1994, 10/12/1995   MMR 10/12/1995, 06/20/2015   OPV 01/04/1994, 03/08/1994, 05/27/1994  Tdap 04/13/2005, 06/20/2015   We updated and reviewed the patient's past history in detail and it is documented below. Allergies: Patient is allergic to morphine and  related. Past Medical History  has a past medical history of ADHD (attention deficit hyperactivity disorder) and Anxiety. Past Surgical History  has a past surgical history that includes Widsom teeth extraction. Social History Patient  reports that he has never smoked. He has never been exposed to tobacco smoke. He has never used smokeless tobacco. He reports current alcohol use. He reports that he does not use drugs. Family History Patient family history includes ADD / ADHD in his brother, father, and mother; Sleep disorder in his father and mother. Review of Systems: Constitutional: negative for fever or malaise Ophthalmic: negative for photophobia, double vision or loss of vision Cardiovascular: negative for chest pain, dyspnea on exertion, or new LE swelling Respiratory: negative for SOB or persistent cough Gastrointestinal: negative for abdominal pain, change in bowel habits or melena Genitourinary: negative for dysuria or gross hematuria Musculoskeletal: negative for new gait disturbance or muscular weakness Integumentary: negative for new or persistent rashes, no breast lumps Neurological: negative for TIA or stroke symptoms Psychiatric: negative for SI or delusions Allergic/Immunologic: negative for hives  Patient Care Team    Relationship Specialty Notifications Start End  Willow Ora, MD PCP - General Family Medicine  04/08/21   Carron Curie, NP Nurse Practitioner Psychology  01/19/23    Objective  Vitals: BP 114/72   Pulse 85   Temp 98.4 F (36.9 C)   Ht 6\' 1"  (1.854 m)   Wt 224 lb 6.4 oz (101.8 kg)   SpO2 97%   BMI 29.61 kg/m  General:  Well developed, well nourished, no acute distress  Psych:  Alert and orientedx3,normal mood and affect HEENT:  Normocephalic, atraumatic, non-icteric sclera,  oropharynx is clear without mass or exudate, supple neck without adenopathy, or thyromegaly Cardiovascular:  Normal S1, S2, RRR without gallop, rub or murmur,   Respiratory:  Good breath sounds bilaterally, CTAB with normal respiratory effort Gastrointestinal: normal bowel sounds, soft, non-tender, no noted masses. No HSM MSK: Joints are without erythema or swelling.  Skin:  Warm, no rashes Neurologic:    Mental status is normal.  Gross motor and sensory exams are normal. Stable gait. No tremor

## 2023-02-24 ENCOUNTER — Encounter: Payer: Self-pay | Admitting: Physician Assistant

## 2023-02-24 ENCOUNTER — Ambulatory Visit: Payer: BC Managed Care – PPO | Admitting: Physician Assistant

## 2023-02-24 VITALS — BP 120/82 | HR 78 | Temp 97.8°F | Ht 73.0 in | Wt 228.0 lb

## 2023-02-24 DIAGNOSIS — H9202 Otalgia, left ear: Secondary | ICD-10-CM

## 2023-02-24 MED ORDER — NEOMYCIN-POLYMYXIN-HC 3.5-10000-1 OT SUSP: 4.0000 [drp] | Freq: Four times a day (QID) | OTIC | 0 refills | Status: DC

## 2023-02-24 NOTE — Progress Notes (Signed)
Subjective:    Patient ID: Ian [name], male    DOB: February 19, 1996, 27 y.o.   MRN: [id number]  Chief Complaint  Patient presents with   Ear Pain    Pt in office for left ear discomfort on and off for the past two weeks. Pt states not getting worse but not getting better. Pt denies seasonal allergies and not taking any OTC medications    HPI Patient is in today for left ear pain on and off x 2 weeks. More consistent the last 4 days. States last year had a swimmer's ear and didn't want it to be getting to that point again. Not as painful or tender as infection last year. Hearing normal. No discharge. No fever. No dizziness.   Past Medical History:  Diagnosis Date   ADHD (attention deficit hyperactivity disorder)    Anxiety     Past Surgical History:  Procedure Laterality Date   Widsom teeth extraction      Family History  Problem Relation Age of Onset   ADD / ADHD Mother    Sleep disorder Mother    ADD / ADHD Father    Sleep disorder Father    ADD / ADHD Brother     Social History   Tobacco Use   Smoking status: Never    Passive exposure: Never   Smokeless tobacco: Never  Vaping Use   Vaping Use: Never used  Substance Use Topics   Alcohol use: Yes    Comment: socially   Drug use: Never     Allergies  Allergen Reactions   Morphine And Codeine Itching    Review of Systems NEGATIVE UNLESS OTHERWISE INDICATED IN HPI      Objective:     BP 120/82 (BP Location: Left Arm)   Pulse 78   Temp 97.8 F (36.6 C) (Temporal)   Ht 6\' 1"  (1.854 m)   Wt 228 lb (103.4 kg)   SpO2 96%   BMI 30.08 kg/m   Wt Readings from Last 3 Encounters:  02/24/23 228 lb (103.4 kg)  01/19/23 224 lb 6.4 oz (101.8 kg)  05/04/21 230 lb (104.3 kg)    BP Readings from Last 3 Encounters:  02/24/23 120/82  01/19/23 114/72  05/07/21 118/82     Physical Exam Vitals and nursing note reviewed.  Constitutional:      Appearance: Normal appearance.  HENT:     Right Ear: Tympanic  membrane, ear canal and external ear normal. There is no impacted cerumen.     Left Ear: Tympanic membrane and external ear normal. There is no impacted cerumen.     Ears:     Comments: Left ear canal 6 o'clock position there is an area of cerumen and underlying erythema, slight edema.     Nose: No congestion.  Cardiovascular:     Rate and Rhythm: Normal rate and regular rhythm.  Pulmonary:     Effort: Pulmonary effort is normal.     Breath sounds: Normal breath sounds.  Neurological:     Mental Status: He is alert.  Psychiatric:        Mood and Affect: Mood normal.        Assessment & Plan:  Acute ear pain, left  Other orders -     Neomycin-Polymyxin-HC; Place 4 drops into the left ear 4 (four) times daily.  Dispense: 10 mL; Refill: 0   Reassured pt TM looks normal. No overwhelming evidence of swimmer's ear. May be irritation or acne at the  6 o'clock. Use cortisporin drops as directed. May use heating pad x 5-10 minutes for added relief. Recheck if worse or not improving.    F/up prn     Shain Pauwels M Cantrell Martus, PA-C

## 2023-05-19 DIAGNOSIS — F411 Generalized anxiety disorder: Secondary | ICD-10-CM | POA: Diagnosis not present

## 2023-05-19 DIAGNOSIS — Z719 Counseling, unspecified: Secondary | ICD-10-CM | POA: Diagnosis not present

## 2023-05-19 DIAGNOSIS — F900 Attention-deficit hyperactivity disorder, predominantly inattentive type: Secondary | ICD-10-CM | POA: Diagnosis not present

## 2023-08-18 DIAGNOSIS — F900 Attention-deficit hyperactivity disorder, predominantly inattentive type: Secondary | ICD-10-CM | POA: Diagnosis not present

## 2023-08-18 DIAGNOSIS — F411 Generalized anxiety disorder: Secondary | ICD-10-CM | POA: Diagnosis not present

## 2023-10-06 DIAGNOSIS — H52203 Unspecified astigmatism, bilateral: Secondary | ICD-10-CM | POA: Diagnosis not present

## 2023-10-06 DIAGNOSIS — H31002 Unspecified chorioretinal scars, left eye: Secondary | ICD-10-CM | POA: Diagnosis not present

## 2023-11-03 DIAGNOSIS — F411 Generalized anxiety disorder: Secondary | ICD-10-CM | POA: Diagnosis not present

## 2023-11-10 DIAGNOSIS — F411 Generalized anxiety disorder: Secondary | ICD-10-CM | POA: Diagnosis not present

## 2023-11-14 DIAGNOSIS — F411 Generalized anxiety disorder: Secondary | ICD-10-CM | POA: Diagnosis not present

## 2023-11-14 DIAGNOSIS — F900 Attention-deficit hyperactivity disorder, predominantly inattentive type: Secondary | ICD-10-CM | POA: Diagnosis not present

## 2023-11-17 DIAGNOSIS — F411 Generalized anxiety disorder: Secondary | ICD-10-CM | POA: Diagnosis not present

## 2023-11-24 DIAGNOSIS — F411 Generalized anxiety disorder: Secondary | ICD-10-CM | POA: Diagnosis not present

## 2023-12-01 DIAGNOSIS — F411 Generalized anxiety disorder: Secondary | ICD-10-CM | POA: Diagnosis not present

## 2023-12-08 DIAGNOSIS — F411 Generalized anxiety disorder: Secondary | ICD-10-CM | POA: Diagnosis not present

## 2023-12-15 DIAGNOSIS — F411 Generalized anxiety disorder: Secondary | ICD-10-CM | POA: Diagnosis not present

## 2023-12-26 DIAGNOSIS — F411 Generalized anxiety disorder: Secondary | ICD-10-CM | POA: Diagnosis not present

## 2023-12-26 DIAGNOSIS — F900 Attention-deficit hyperactivity disorder, predominantly inattentive type: Secondary | ICD-10-CM | POA: Diagnosis not present

## 2024-01-05 DIAGNOSIS — F411 Generalized anxiety disorder: Secondary | ICD-10-CM | POA: Diagnosis not present

## 2024-01-12 DIAGNOSIS — F411 Generalized anxiety disorder: Secondary | ICD-10-CM | POA: Diagnosis not present

## 2024-01-19 DIAGNOSIS — F411 Generalized anxiety disorder: Secondary | ICD-10-CM | POA: Diagnosis not present

## 2024-01-23 ENCOUNTER — Encounter: Payer: BC Managed Care – PPO | Admitting: Family Medicine

## 2024-01-26 DIAGNOSIS — F411 Generalized anxiety disorder: Secondary | ICD-10-CM | POA: Diagnosis not present

## 2024-01-30 DIAGNOSIS — F411 Generalized anxiety disorder: Secondary | ICD-10-CM | POA: Diagnosis not present

## 2024-01-30 DIAGNOSIS — F900 Attention-deficit hyperactivity disorder, predominantly inattentive type: Secondary | ICD-10-CM | POA: Diagnosis not present

## 2024-01-30 DIAGNOSIS — G4700 Insomnia, unspecified: Secondary | ICD-10-CM | POA: Diagnosis not present

## 2024-01-30 DIAGNOSIS — Z719 Counseling, unspecified: Secondary | ICD-10-CM | POA: Diagnosis not present

## 2024-02-07 DIAGNOSIS — F411 Generalized anxiety disorder: Secondary | ICD-10-CM | POA: Diagnosis not present

## 2024-02-16 ENCOUNTER — Encounter: Admitting: Family Medicine

## 2024-02-21 DIAGNOSIS — F411 Generalized anxiety disorder: Secondary | ICD-10-CM | POA: Diagnosis not present

## 2024-02-28 DIAGNOSIS — F411 Generalized anxiety disorder: Secondary | ICD-10-CM | POA: Diagnosis not present

## 2024-03-06 DIAGNOSIS — F411 Generalized anxiety disorder: Secondary | ICD-10-CM | POA: Diagnosis not present

## 2024-03-13 DIAGNOSIS — F411 Generalized anxiety disorder: Secondary | ICD-10-CM | POA: Diagnosis not present

## 2024-03-14 DIAGNOSIS — F900 Attention-deficit hyperactivity disorder, predominantly inattentive type: Secondary | ICD-10-CM | POA: Diagnosis not present

## 2024-03-14 DIAGNOSIS — F411 Generalized anxiety disorder: Secondary | ICD-10-CM | POA: Diagnosis not present

## 2024-04-03 DIAGNOSIS — F411 Generalized anxiety disorder: Secondary | ICD-10-CM | POA: Diagnosis not present

## 2024-04-16 ENCOUNTER — Encounter: Payer: Self-pay | Admitting: Family Medicine

## 2024-04-16 ENCOUNTER — Ambulatory Visit (INDEPENDENT_AMBULATORY_CARE_PROVIDER_SITE_OTHER): Admitting: Family Medicine

## 2024-04-16 VITALS — BP 124/82 | HR 93 | Temp 98.0°F | Ht 73.0 in | Wt 234.6 lb

## 2024-04-16 DIAGNOSIS — Z800 Family history of malignant neoplasm of digestive organs: Secondary | ICD-10-CM

## 2024-04-16 DIAGNOSIS — Z0000 Encounter for general adult medical examination without abnormal findings: Secondary | ICD-10-CM

## 2024-04-16 DIAGNOSIS — F411 Generalized anxiety disorder: Secondary | ICD-10-CM | POA: Diagnosis not present

## 2024-04-16 DIAGNOSIS — F4321 Adjustment disorder with depressed mood: Secondary | ICD-10-CM | POA: Diagnosis not present

## 2024-04-16 NOTE — Addendum Note (Signed)
 Addended by: JODIE GAMMONS on: 04/16/2024 04:45 PM   Modules accepted: Orders

## 2024-04-16 NOTE — Patient Instructions (Signed)
 Please return in 12 months for your annual complete physical; please come fasting.   If you have any questions or concerns, please don't hesitate to send me a message via MyChart or call the office at [phone]. Thank you for visiting with us  today! It's our pleasure caring for you.   Managing Loss, Adult People experience loss in many different ways throughout their lives. Events such as moving, changing jobs, and losing friends can create a sense of loss. The loss may be as serious as a major health change, divorce, death of a pet, or death of a loved one. All of these types of loss are likely to create a physical and emotional reaction known as grief. Grief is the result of a major change or an absence of something or someone that you count on. Grief is a normal reaction to loss. A variety of factors can affect your grieving experience, including: The nature of your loss. Your relationship to what or whom you lost. Your understanding of grief and how to manage it. Your support system. Be aware that when grief becomes extreme, it can lead to more severe issues like isolation, depression, anxiety, or suicidal thoughts. Talk with your health care provider if you have any of these issues. How to manage lifestyle changes Keep to your normal routine as much as possible. If you have trouble focusing or doing normal activities, it is acceptable to take some time away from your normal routine. Spend time with friends and loved ones. Eat a healthy diet, get plenty of sleep, and rest when you feel tired. How to recognize changes  The way that you deal with your grief will affect your ability to function as you normally do. When grieving, you may experience these changes: Numbness, shock, sadness, anxiety, anger, denial, and guilt. Thoughts about death. Unexpected crying. A physical sensation of emptiness in your stomach. Problems sleeping and eating. Tiredness (fatigue). Loss of interest in  normal activities. Dreaming about or imagining seeing the person who died. A need to remember what or whom you lost. Difficulty thinking about anything other than your loss for a period of time. Relief. If you have been expecting the loss for a while, you may feel a sense of relief when it happens. Follow these instructions at home: Activity Express your feelings in healthy ways, such as: Talking with others about your loss. It may be helpful to find others who have had a similar loss, such as a support group. Writing down your feelings in a journal. Doing physical activities to release stress and emotional energy. Doing creative activities like painting, sculpting, or playing or listening to music. Practicing resilience. This is the ability to recover and adjust after facing challenges. Reading some resources that encourage resilience may help you to learn ways to practice those behaviors.  General instructions Be patient with yourself and others. Allow the grieving process to happen, and remember that grieving takes time. It is likely that you may never feel completely done with some grief. You may find a way to move on while still cherishing memories and feelings about your loss. Accepting your loss is a process. It can take months or longer to adjust. Keep all follow-up visits. This is important. Where to find support To get support for managing loss: Ask your health care provider for help and recommendations, such as grief counseling or therapy. Think about joining a support group for people who are managing a loss. Where to find more information You can  find more information about managing loss from: American Society of Clinical Oncology: www.cancer.net American Psychological Association: [employer] Contact a health care provider if: Your grief is extreme and keeps getting worse. You have ongoing grief that does not improve. Your body shows symptoms of grief, such as  illness. You feel depressed, anxious, or hopeless. Get help right away if: You have thoughts about hurting yourself or others. Get help right away if you feel like you may hurt yourself or others, or have thoughts about taking your own life. Go to your nearest emergency room or: Call 911. Call the National Suicide Prevention Lifeline at (986) 353-3857 or 988. This is open 24 hours a day. Text the Crisis Text Line at (248) 351-0910. Summary Grief is the result of a major change or an absence of someone or something that you count on. Grief is a normal reaction to loss. The depth of grief and the period of recovery depend on the type of loss and your ability to adjust to the change and process your feelings. Processing grief requires patience and a willingness to accept your feelings and talk about your loss with people who are supportive. It is important to find resources that work for you and to realize that people experience grief differently. There is not one grieving process that works for everyone in the same way. Be aware that when grief becomes extreme, it can lead to more severe issues like isolation, depression, anxiety, or suicidal thoughts. Talk with your health care provider if you have any of these issues. This information is not intended to replace advice given to you by your health care provider. Make sure you discuss any questions you have with your health care provider. Document Revised: 04/20/2021 Document Reviewed: 04/20/2021 Elsevier Patient Education  2024 [employer].

## 2024-04-16 NOTE — Progress Notes (Signed)
 Subjective  Chief Complaint  Patient presents with   Annual Exam    Pt here for Annual Exam and is currently not fasting. Pt lost his om 3mos ago   HPI: Ian [name] is a 28 y.o. male who presents to [facility] at [place] today for a Male Wellness Visit.   Wellness Visit: annual visit with health maintenance review and exam   HM: Physically healthy male here for complete physical.  His mother passed from pancreatic cancer in April.  Trying to cope with his grief and loss.  Fortunately, he was able to spend a lot of time with her in the last year of her life.  He admits to feeling fatigued most days of the week, poor sleep and sadness.  His anxiety disorder is managed by psychiatry and they have adjusted his sertraline  dose upward to help with his symptoms.  He declines sleep medications.  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  Diet is fair.  Appetite is starting to come back.  Motivation is low.  Body mass index is 30.95 kg/m. Wt Readings from Last 3 Encounters:  04/16/24 234 lb 9.6 oz (106.4 kg)  02/24/23 228 lb (103.4 kg)  01/19/23 224 lb 6.4 oz (101.8 kg)   Assessment  1. Annual physical exam   2. Generalized anxiety disorder      Plan  Male Wellness Visit: Age appropriate Health Maintenance and Prevention measures were discussed with patient. Included topics are cancer screening recommendations, ways to keep healthy (see AVS) including dietary and exercise recommendations, regular eye and dental care, use of seat belts, and avoidance of moderate alcohol use and tobacco use.  BMI: discussed patient's BMI and encouraged positive lifestyle modifications to help get to or maintain a target BMI. HM needs and immunizations were addressed and ordered. See below for orders. See HM and immunization section for updates. Deferring lab test today.  Labs reviewed  from last year, all normal.  Patient has needle phobia.  Will check again next year. Discussed recommendations regarding Vit D and calcium supplementation (see AVS) Grief counseling done.  Recommend good nutrition and daily walking for exercise.  Consider sleep medications but patient is hesitant at this time.  I do believe he is chronically sleep deprived.  Continue mood medications per psychiatry.  Follow up: 1 year for complete physical Commons side effects, risks, benefits, and alternatives for medications and treatment plan prescribed today were discussed, and the patient expressed understanding of the given instructions. Patient is instructed to call or message via MyChart if he/she has any questions or concerns regarding our treatment plan. No barriers to understanding were identified. We discussed Red Flag symptoms and signs in detail. Patient expressed understanding regarding what to do in case of urgent or emergency type symptoms.  Medication list was reconciled, printed and provided to the patient in AVS. Patient instructions and summary information was reviewed with the patient as documented in the AVS. This note was prepared with assistance of Dragon voice recognition software. Occasional wrong-word or sound-a-like substitutions may have occurred due to the inherent limitations of voice recognition software   No orders of the defined types were placed in this encounter.  No orders of the defined types were placed in this encounter.      Patient Active Problem List   Diagnosis Date Noted  Needle phobia 04/08/2021   Generalized anxiety disorder 11/17/2015   ADHD (attention deficit hyperactivity disorder), combined type 11/17/2015   Health Maintenance  Topic Date Due   HPV VACCINES (1 - 3-dose SCDM series) Never done   INFLUENZA VACCINE  04/13/2024   COVID-19 Vaccine (1) 05/02/2024 (Originally 11/01/1998)   DTaP/Tdap/Td (7 - Td or Tdap) 06/19/2025   Hepatitis B Vaccines   Completed   Hepatitis C Screening  Completed   HIV Screening  Completed   Meningococcal B Vaccine  Aged Out   Immunization History  Administered Date(s) Administered   DTaP 01/04/1994, 03/08/1994, 05/27/1994, 10/12/1995   HIB (PRP-OMP) 01/04/1994, 03/08/1994, 05/27/1994, 10/12/1995   Hepatitis B, PED/ADOLESCENT 1994[birth year], 01/04/1994, 03/08/1994, 05/27/1994, 10/05/1994, 10/12/1995   MMR 10/12/1995, 06/20/2015   OPV 01/04/1994, 03/08/1994, 05/27/1994   Tdap 04/13/2005, 06/20/2015   We updated and reviewed the patient's past history in detail and it is documented below. Allergies: Patient is allergic to morphine and codeine. Past Medical History  has a past medical history of ADHD (attention deficit hyperactivity disorder) and Anxiety. Past Surgical History  has a past surgical history that includes Widsom teeth extraction. Social History Patient  reports that he has never smoked. He has never been exposed to tobacco smoke. He has never used smokeless tobacco. He reports current alcohol use. He reports that he does not use drugs. Family History Patient family history includes ADD / ADHD in his brother, father, and mother; Pancreatic cancer (age of onset: 1) in his mother; Sleep disorder in his father and mother. Review of Systems: Constitutional: negative for fever or malaise Ophthalmic: negative for photophobia, double vision or loss of vision Cardiovascular: negative for chest pain, dyspnea on exertion, or new LE swelling Respiratory: negative for SOB or persistent cough Gastrointestinal: negative for abdominal pain, change in bowel habits or melena Genitourinary: negative for dysuria or gross hematuria Musculoskeletal: negative for new gait disturbance or muscular weakness Integumentary: negative for new or persistent rashes, no breast lumps Neurological: negative for TIA or stroke symptoms Psychiatric: negative for SI or delusions Allergic/Immunologic: negative for  hives  Patient Care Team    Relationship Specialty Notifications Start End  Jodie Lavern CROME, MD PCP - General Family Medicine  04/08/21   Consuela Stephane HERO, NP Nurse Practitioner Psychology  01/19/23    Objective  Vitals: BP 124/82   Pulse 93   Temp 98 F (36.7 C)   Ht 6' 1 (1.854 m)   Wt 234 lb 9.6 oz (106.4 kg)   SpO2 97%   BMI 30.95 kg/m  General:  Well developed, well nourished, no acute distress  Psych:  Alert and orientedx3, flat mood and affect, hypokinetic HEENT:  Normocephalic, atraumatic, non-icteric sclera,  oropharynx is clear without mass or exudate, supple neck without adenopathy, or thyromegaly Cardiovascular:  Normal S1, S2, RRR without gallop, rub or murmur,  Respiratory:  Good breath sounds bilaterally, CTAB with normal respiratory effort Gastrointestinal: normal bowel sounds, soft, non-tender, no noted masses. No HSM MSK: Joints are without erythema or swelling.  Skin:  Warm, no rashes

## 2024-05-01 DIAGNOSIS — F411 Generalized anxiety disorder: Secondary | ICD-10-CM | POA: Diagnosis not present

## 2024-05-15 DIAGNOSIS — F411 Generalized anxiety disorder: Secondary | ICD-10-CM | POA: Diagnosis not present

## 2024-05-29 DIAGNOSIS — F411 Generalized anxiety disorder: Secondary | ICD-10-CM | POA: Diagnosis not present

## 2024-06-20 DIAGNOSIS — F900 Attention-deficit hyperactivity disorder, predominantly inattentive type: Secondary | ICD-10-CM | POA: Diagnosis not present

## 2024-06-20 DIAGNOSIS — F411 Generalized anxiety disorder: Secondary | ICD-10-CM | POA: Diagnosis not present

## 2024-06-26 DIAGNOSIS — F411 Generalized anxiety disorder: Secondary | ICD-10-CM | POA: Diagnosis not present

## 2024-07-10 DIAGNOSIS — F411 Generalized anxiety disorder: Secondary | ICD-10-CM | POA: Diagnosis not present

## 2024-07-16 ENCOUNTER — Other Ambulatory Visit: Payer: Self-pay

## 2024-07-16 ENCOUNTER — Inpatient Hospital Stay

## 2024-07-16 DIAGNOSIS — Z800 Family history of malignant neoplasm of digestive organs: Secondary | ICD-10-CM | POA: Diagnosis not present

## 2024-07-16 DIAGNOSIS — Z1379 Encounter for other screening for genetic and chromosomal anomalies: Secondary | ICD-10-CM | POA: Insufficient documentation

## 2024-07-16 DIAGNOSIS — Z808 Family history of malignant neoplasm of other organs or systems: Secondary | ICD-10-CM | POA: Diagnosis not present

## 2024-07-16 LAB — GENETIC SCREENING ORDER

## 2024-07-16 NOTE — Progress Notes (Addendum)
 REFERRING PROVIDER: Jodie Lavern CROME, MD [address] [place],  [place] [address]  PRIMARY PROVIDER:  Jodie Lavern CROME, MD  PRIMARY REASON FOR VISIT:  No diagnosis found.  HISTORY OF PRESENT ILLNESS:   Ian [name], a 28 y.o. male, was seen for a Ian [name] cancer genetics consultation at the request of Ian Lavern CROME, MD due to a family history of pancreatic cancer.  Ian [name] presents to clinic today to discuss the possibility of a hereditary predisposition to cancer, genetic testing, and to further clarify his future cancer risks, as well as potential cancer risks for family members.   CANCER HISTORY:  Ian [name] is a 28 y.o. male with no personal history of cancer.    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  Tobacco Use: Never Past Medical History:  Diagnosis Date   ADHD (attention deficit hyperactivity disorder)    Anxiety    Past Surgical History:  Procedure Laterality Date   [place] teeth extraction      Social History   Socioeconomic History   Marital status: Single    Spouse name: Not on file   Number of children: Not on file   Years of education: Not on file   Highest education level: Not on file  Occupational History   Not on file  Tobacco Use   Smoking status: Never    Passive exposure: Never   Smokeless tobacco: Never  Vaping Use   Vaping status: Never Used  Substance and Sexual Activity   Alcohol use: Yes    Comment: socially   Drug use: Never   Sexual activity: Not Currently  Other Topics Concern   Not on file  Social History Narrative   Not on file   Social Drivers of Health   Financial Resource Strain: Not on file  Food Insecurity: Not on file  Transportation Needs: Not on file  Physical Activity: Not on file  Stress: Not on file  Social Connections: Not on file     FAMILY HISTORY:  We obtained a detailed, 4-generation family history pasted  below.   Ian [name] is unaware of relatives completing genetic testing for hereditary cancer risks.   Significant diagnoses are listed below: Family History  Problem Relation Age of Onset   ADD / ADHD Mother    Sleep disorder Mother    Pancreatic cancer Mother 22   ADD / ADHD Father    Sleep disorder Father    ADD / ADHD Brother    Melanoma Maternal Grandmother 79 - 90   Cancer Paternal Grandmother        [facility] Cancer  Tobacco User      GENETIC COUNSELING ASSESSMENT: Ian [name] is a 28 y.o. male with a family history of pancreatic cancer which is somewhat suggestive of a hereditary cancer predisposition syndrome. We, therefore, discussed and recommended the following at today's visit.   DISCUSSION: We discussed that, in general, most cancer is not inherited in families, but instead is sporadic or familial. Sporadic cancers occur by chance and typically happen at older ages (>50 years) as this type of cancer is caused by genetic changes acquired during an individual's lifetime. Some families have more cancers than would be expected by chance; however, the ages or types of cancer are not consistent with a known genetic mutation or known genetic mutations have been ruled out. This type of familial cancer is thought to be due to a combination of  multiple genetic, environmental, hormonal, and lifestyle factors. While this combination of factors likely increases the risk of cancer, the exact source of this risk is not currently identifiable or testable.  We discussed that 5-10% of cancer is the result of germline (heritable) genetic variants, with most cases associated with BRCA1/BRCA2. There are other genes that can be associated with hereditary cancer syndromes. We discussed that testing is beneficial for several reasons including knowing how to follow individuals after completing their treatment, identifying whether potential treatment options such as PARP inhibitors would be beneficial,  and understanding if other family members could be at risk for cancer and allow them to undergo genetic testing.   We reviewed the characteristics, features and inheritance patterns of hereditary cancer syndromes. We also discussed genetic testing, including the appropriate family members to test, the process of testing, insurance coverage and turn-around-time for results. We discussed the implications of a negative, positive, carrier and/or variant of uncertain significant result. Ian [name]  was offered a common hereditary cancer [name] (40 genes) and an expanded pan-cancer [name] (77 genes). Ian [name] was informed of the benefits and limitations of each [name], including that expanded pan-cancer panels contain genes that do not have clear management guidelines at this point in time.  We also discussed that as the number of genes included on a [name] increases, the chances of variants of uncertain significance increases.  GENETIC TESTING NATIONAL CRITERIA: Based on Ian [name]'s family history of pancreatic cancer in his mother he meets medical criteria for genetic testing based on the [employer] (NCCN) guidelines. Though Ian [name] is not personally affected, there are no affected family members that are willing/able/available to undergo hereditary cancer testing. Therefore, Ian [name] the most informative family member available. Despite that he meets criteria, he may still have an out of pocket cost. We discussed that if his out of pocket cost for testing is over $100, the laboratory will call and confirm whether he wants to proceed with testing.  If the out of pocket cost of testing is less than $100 he will be billed by the genetic testing laboratory.   GENETIC TESTING CONSENT:  After considering the risks, benefits, and limitations, Ian [name] provided informed consent to pursue genetic testing. A blood sample was sent to Ian [name] for analysis of the  CancerNext+RNA [name]. Results should be available within approximately 2-3 weeks' time, at which point they will be disclosed by telephone to Ian [name] , as will any additional recommendations warranted by these results. Ian [name] will receive a summary of her genetic counseling visit and a copy of his results once available. This information will also be available in Epic.  Ian [name] which includes sequencing, rearrangement analysis, and RNA analysis for the following 40 genes: APC, ATM, BAP1, BARD1, BMPR1A, BRCA1, BRCA2, BRIP1, CDH1, CDKN2A, CHEK2, FH, FLCN, MET, MLH1, MSH2, MSH6, MUTYH, NF1, NTHL1, PALB2, PMS2, PTEN, RAD51C, RAD51D, RPS20, SMAD4, STK11, TP53, TSC1, TSC2, and VHL (sequencing and deletion/duplication); AXIN2, HOXB13, MBD4, MSH3, POLD1 and POLE (sequencing only); EPCAM and GREM1 (deletion/duplication only).   GENETIC INFORMATION NONDISCRIMINATION ACT (GINA): We discussed that some people do not want to undergo genetic testing due to fear of genetic discrimination.  The Genetic Information Nondiscrimination Act (GINA) was signed into federal law in 2008. GINA prohibits health insurers and most employers from discriminating against individuals based on genetic information (including the results of genetic tests and family history information). According to GINA, health insurance companies cannot  consider genetic information to be a preexisting condition, nor can they use it to make decisions regarding coverage or rates. GINA also makes it illegal for most employers to use genetic information in making decisions about hiring, firing, promotion, or terms of employment. It is important to note that GINA does not offer protections for life insurance, disability insurance, or long-term care insurance. GINA does not apply to those in the [employer], those who work for companies with less than 15 employees, and new life insurance or long-term disability insurance  policies.  Health status due to a cancer diagnosis is not protected under GINA. More information about GINA can be found by visiting eliteclients.be.  Lastly, we encouraged Leaman Abe to remain in contact with cancer genetics annually so that we can continuously update the family history and inform him of any changes in cancer genetics and testing that may be of benefit for this family.   Adrin Dilling's questions were answered to his satisfaction today. Our contact information was provided should additional questions or concerns arise. Thank you for the referral and allowing us  to share in the care of your patient.   Resources:  Keino Placencia was provided with the following:  [facility] Hereditary Cancer Testing Patient Guide [employer] Gene List  PLAN:  Testing Ordered: CancerNext+RNA [name] [facility] Patient Assistance completed during visit (OOP $0) Clinic Note Faxed/Routed to [facility] PCP Ian Lavern CROME, MD  May discuss contacting [name] to gather more information about his mother's care and possibility of postmortem genetic testing.  Betha Shadix Lindsey-Mills, MS, CGC  Certified Genetic Counselor  Email: [email or web address]  Phone: [phone]  I personally spent a total of 60 minutes in the care of the patient today including preparing to see the patient, counseling and educating, placing orders, and documenting clinical information in the EHR.  The patient was seen alone. Drs. Lanny Stalls, and/or Gudena were available for questions, if needed. _______________________________________________________________________ For Office Staff:  Number of people involved in session: 1 Was an Intern/ student involved with case: no

## 2024-07-24 DIAGNOSIS — F411 Generalized anxiety disorder: Secondary | ICD-10-CM | POA: Diagnosis not present

## 2024-08-01 DIAGNOSIS — F411 Generalized anxiety disorder: Secondary | ICD-10-CM | POA: Diagnosis not present

## 2024-08-01 DIAGNOSIS — F900 Attention-deficit hyperactivity disorder, predominantly inattentive type: Secondary | ICD-10-CM | POA: Diagnosis not present

## 2024-08-06 ENCOUNTER — Telehealth: Payer: Self-pay

## 2024-08-06 DIAGNOSIS — Z1379 Encounter for other screening for genetic and chromosomal anomalies: Secondary | ICD-10-CM | POA: Insufficient documentation

## 2024-08-06 NOTE — Telephone Encounter (Signed)
 Called pt to review results but unable to LM as VM was full.

## 2024-08-10 ENCOUNTER — Telehealth: Payer: Self-pay | Admitting: Genetic Counselor

## 2024-08-10 NOTE — Telephone Encounter (Signed)
 LM on VM that results are back and to please call.  Left CB instructions.

## 2024-08-13 ENCOUNTER — Ambulatory Visit: Payer: Self-pay | Admitting: Genetic Counselor

## 2024-08-13 DIAGNOSIS — Z1379 Encounter for other screening for genetic and chromosomal anomalies: Secondary | ICD-10-CM

## 2024-08-13 NOTE — Progress Notes (Signed)
 HPI:  [name] was previously seen in the [facility] due to a family history of pancreatic cancer and concerns regarding a hereditary predisposition to cancer. Please refer to our prior cancer genetics clinic note for more information regarding our discussion, assessment and recommendations, at the time. [name] recent genetic test results were disclosed to him, as were recommendations warranted by these results. These results and recommendations are discussed in more detail below.  CANCER HISTORY:  Oncology History   No history exists.    FAMILY HISTORY:  We obtained a detailed, 4-generation family history.  Significant diagnoses are listed below: Family History  Problem Relation Age of Onset   ADD / ADHD Mother    Sleep disorder Mother    Pancreatic cancer Mother 63   ADD / ADHD Father    Sleep disorder Father    ADD / ADHD Brother    Melanoma Maternal Grandmother 20 - 89   Cancer Paternal Grandmother        [facility]won Cancer  Tobacco User        GENETIC TEST RESULTS: Genetic testing reported out on July 24, 2024 through the CancerNext+RNAinsight cancer panel found no pathogenic mutations. The [facility] CancerNext+RNAinsight Panel includes sequencing, rearrangement analysis, and RNA analysis for the following 40 genes: APC, ATM, BAP1, BARD1, BMPR1A, BRCA1, BRCA2, BRIP1, CDH1, CDKN2A, CHEK2, FH, FLCN, MET, MLH1, MSH2, MSH6, MUTYH, NF1, NTHL1, PALB2, PMS2, PTEN, RAD51C, RAD51D, RPS20, SMAD4, STK11, TP53, TSC1, TSC2 and VHL (sequencing and deletion/duplication); AXIN2, HOXB13, MBD4, MSH3, POLD1 and POLE (sequencing only); EPCAM and GREM1 (deletion/duplication only). RNA data is routinely analyzed for use in variant interpretation for all genes. The test report has been scanned into EPIC and is located under the Molecular Pathology section of the Results Review tab.  A portion of the result report is included below for reference.     We discussed with Mr.  Ian [name] that because current genetic testing is not perfect, it is possible there may be a gene mutation in one of these genes that current testing cannot detect, but that chance is small.  We also discussed, that there could be another gene that has not yet been discovered, or that we have not yet tested, that is responsible for the cancer diagnoses in the family. It is also possible there is a hereditary cause for the cancer in the family that [name] did not inherit and therefore was not identified in his testing.  Therefore, it is important to remain in touch with cancer genetics in the future so that we can continue to offer [name] the most up to date genetic testing.   ADDITIONAL GENETIC TESTING: We discussed with [name] that there are other genes that are associated with increased cancer risk that can be analyzed. Should [name] wish to pursue additional genetic testing, we are happy to discuss and coordinate this testing, at any time.    CANCER SCREENING RECOMMENDATIONS: [name] test result is considered negative (normal).  This means that we have not identified a hereditary cause for his family history of pancreatic cancer at this time.    Possible reasons for [name]'s negative genetic test include:  1. There may be a gene mutation in one of these genes that current testing methods cannot detect but that chance is small.  2. There could be another gene that has not yet been discovered, or that we have not yet tested, that is responsible for the cancer diagnoses in  the family.  3.  There may be no hereditary risk for cancer in the family. The cancers in [name] and/or his family may be sporadic/familial or due to other genetic and environmental factors. 4. It is also possible there is a hereditary cause for the cancer in the family that [name] did not inherit.  Therefore, it is recommended he continue to follow the cancer management and screening guidelines provided  by his primary healthcare provider. An individual's cancer risk and medical management are not determined by genetic test results alone. Overall cancer risk assessment incorporates additional factors, including personal medical history, family history, and any available genetic information that may result in a personalized plan for cancer prevention and surveillance  RECOMMENDATIONS FOR FAMILY MEMBERS:   Since he did not inherit a identifiable mutation in a cancer predisposition gene included on this panel, his children could not have inherited a known mutation from his in one of these genes. Individuals in this family might be at some increased risk of developing cancer, over the general population risk, simply due to the family history of cancer.  We recommended women in this family have a yearly mammogram beginning at age 69, or 13 years younger than the earliest onset of cancer, an annual clinical breast exam, and perform monthly breast self-exams. Women in this family should also have a gynecological exam as recommended by their primary provider. All family members should be referred for colonoscopy starting at age 1, or 25 years younger than the earliest onset of cancer. It is also possible there is a hereditary cause for the cancer in [name] family that he did not inherit and therefore was not identified in him.  Based on [name] family history, we recommended his brother, Ian [name] have genetic counseling and testing. [name] will let us  know if we can be of any assistance in coordinating genetic counseling and/or testing for this family member.   FOLLOW-UP: Lastly, we discussed with [name] that cancer genetics is a rapidly advancing field and it is possible that new genetic tests will be appropriate for him and/or his family members in the future. We encouraged him to remain in contact with cancer genetics on an annual basis so we can update his personal and family histories and let him  know of advances in cancer genetics that may benefit this family.   Our contact number was provided. [name] questions were answered to his satisfaction, and he knows he is welcome to call us  at anytime with additional questions or concerns.   Darice Monte, MS, [facility] Licensed, Certified Genetic Counselor [email or web address]

## 2024-08-13 NOTE — Telephone Encounter (Signed)
 I contacted  Ian [name] to discuss his genetic testing results. No pathogenic variants were identified in the 40 genes analyzed. Discussed that we do not know why he has cancer in the family. It could be due to a different gene that we are not testing, or maybe our current technology may not be able to pick something up.  It will be important for him to keep in contact with genetics to keep up with whether additional testing may be needed.Detailed clinic note to follow.   The test report will be scanned into EPIC and will be located under the Molecular Pathology section of the Results Review tab.  A portion of the result report is included below for reference.

## 2024-08-14 DIAGNOSIS — F411 Generalized anxiety disorder: Secondary | ICD-10-CM | POA: Diagnosis not present

## 2025-04-17 ENCOUNTER — Encounter: Admitting: Family Medicine
